# Patient Record
Sex: Female | Born: 1975 | Race: White | Hispanic: No | Smoking: Current every day smoker
Health system: Southern US, Community
[De-identification: ages and names within clinical notes are randomized; demographics above are authoritative.]

## PROBLEM LIST (undated history)

## (undated) DIAGNOSIS — M1990 Unspecified osteoarthritis, unspecified site: Secondary | ICD-10-CM

## (undated) DIAGNOSIS — A048 Other specified bacterial intestinal infections: Secondary | ICD-10-CM

## (undated) DIAGNOSIS — F329 Major depressive disorder, single episode, unspecified: Secondary | ICD-10-CM

## (undated) DIAGNOSIS — K529 Noninfective gastroenteritis and colitis, unspecified: Secondary | ICD-10-CM

## (undated) DIAGNOSIS — K2970 Gastritis, unspecified, without bleeding: Secondary | ICD-10-CM

## (undated) DIAGNOSIS — F4310 Post-traumatic stress disorder, unspecified: Secondary | ICD-10-CM

## (undated) DIAGNOSIS — F419 Anxiety disorder, unspecified: Secondary | ICD-10-CM

## (undated) DIAGNOSIS — F1920 Other psychoactive substance dependence, uncomplicated: Secondary | ICD-10-CM

## (undated) DIAGNOSIS — M67919 Unspecified disorder of synovium and tendon, unspecified shoulder: Secondary | ICD-10-CM

## (undated) DIAGNOSIS — F1020 Alcohol dependence, uncomplicated: Secondary | ICD-10-CM

## (undated) DIAGNOSIS — F32A Depression, unspecified: Secondary | ICD-10-CM

## (undated) DIAGNOSIS — S4290XA Fracture of unspecified shoulder girdle, part unspecified, initial encounter for closed fracture: Secondary | ICD-10-CM

## (undated) DIAGNOSIS — G479 Sleep disorder, unspecified: Secondary | ICD-10-CM

## (undated) DIAGNOSIS — N83209 Unspecified ovarian cyst, unspecified side: Secondary | ICD-10-CM

## (undated) DIAGNOSIS — M519 Unspecified thoracic, thoracolumbar and lumbosacral intervertebral disc disorder: Secondary | ICD-10-CM

## (undated) DIAGNOSIS — R110 Nausea: Secondary | ICD-10-CM

## (undated) DIAGNOSIS — M719 Bursopathy, unspecified: Secondary | ICD-10-CM

## (undated) DIAGNOSIS — S02600B Fracture of unspecified part of body of mandible, initial encounter for open fracture: Secondary | ICD-10-CM

## (undated) HISTORY — PX: APPENDECTOMY: SHX54

## (undated) HISTORY — PX: HERNIA REPAIR: SHX51

## (undated) HISTORY — PX: TUBAL LIGATION: SHX77

## (undated) HISTORY — PX: KNEE ARTHROSCOPY: SUR90

## (undated) HISTORY — PX: OOPHORECTOMY: SHX86

## (undated) HISTORY — PX: MANDIBLE FRACTURE SURGERY: SHX706

## (undated) HISTORY — PX: DILATION AND EVACUATION: SHX1459

## (undated) HISTORY — DX: Noninfective gastroenteritis and colitis, unspecified: K52.9

---

## 2011-02-02 DIAGNOSIS — G8929 Other chronic pain: Secondary | ICD-10-CM | POA: Diagnosis present

## 2011-02-02 DIAGNOSIS — F329 Major depressive disorder, single episode, unspecified: Secondary | ICD-10-CM | POA: Diagnosis present

## 2011-11-24 ENCOUNTER — Other Ambulatory Visit: Payer: Self-pay | Admitting: Oral Surgery

## 2011-11-24 DIAGNOSIS — S02609A Fracture of mandible, unspecified, initial encounter for closed fracture: Secondary | ICD-10-CM

## 2011-11-26 ENCOUNTER — Other Ambulatory Visit: Payer: Self-pay

## 2011-11-30 ENCOUNTER — Other Ambulatory Visit: Payer: Self-pay | Admitting: Oral Surgery

## 2011-11-30 ENCOUNTER — Ambulatory Visit
Admission: RE | Admit: 2011-11-30 | Discharge: 2011-11-30 | Disposition: A | Discharge status: Home / Self Care | Payer: Medicaid Other | Source: Ambulatory Visit | Attending: Oral Surgery | Admitting: Oral Surgery

## 2011-11-30 ENCOUNTER — Other Ambulatory Visit: Payer: Self-pay

## 2011-11-30 DIAGNOSIS — S02609A Fracture of mandible, unspecified, initial encounter for closed fracture: Secondary | ICD-10-CM

## 2012-01-17 ENCOUNTER — Other Ambulatory Visit (HOSPITAL_COMMUNITY): Payer: Self-pay | Admitting: Oral Surgery

## 2012-01-23 ENCOUNTER — Emergency Department (HOSPITAL_COMMUNITY)
Admission: EM | Admit: 2012-01-23 | Discharge: 2012-01-24 | Disposition: A | Discharge status: Other Acute Inpatient Hospital | Payer: 59 | Source: Home / Self Care

## 2012-01-23 ENCOUNTER — Encounter (HOSPITAL_COMMUNITY): Payer: Self-pay | Admitting: Emergency Medicine

## 2012-01-23 ENCOUNTER — Emergency Department (HOSPITAL_COMMUNITY): Payer: 59

## 2012-01-23 DIAGNOSIS — M719 Bursopathy, unspecified: Secondary | ICD-10-CM | POA: Insufficient documentation

## 2012-01-23 DIAGNOSIS — G8929 Other chronic pain: Secondary | ICD-10-CM

## 2012-01-23 DIAGNOSIS — F17200 Nicotine dependence, unspecified, uncomplicated: Secondary | ICD-10-CM | POA: Insufficient documentation

## 2012-01-23 DIAGNOSIS — G479 Sleep disorder, unspecified: Secondary | ICD-10-CM | POA: Insufficient documentation

## 2012-01-23 DIAGNOSIS — F341 Dysthymic disorder: Secondary | ICD-10-CM | POA: Insufficient documentation

## 2012-01-23 DIAGNOSIS — F259 Schizoaffective disorder, unspecified: Secondary | ICD-10-CM | POA: Insufficient documentation

## 2012-01-23 DIAGNOSIS — M67919 Unspecified disorder of synovium and tendon, unspecified shoulder: Secondary | ICD-10-CM | POA: Insufficient documentation

## 2012-01-23 DIAGNOSIS — F32A Depression, unspecified: Secondary | ICD-10-CM

## 2012-01-23 DIAGNOSIS — F329 Major depressive disorder, single episode, unspecified: Secondary | ICD-10-CM

## 2012-01-23 HISTORY — DX: Alcohol dependence, uncomplicated: F10.20

## 2012-01-23 HISTORY — DX: Other psychoactive substance dependence, uncomplicated: F19.20

## 2012-01-23 HISTORY — DX: Unspecified disorder of synovium and tendon, unspecified shoulder: M67.919

## 2012-01-23 HISTORY — DX: Anxiety disorder, unspecified: F41.9

## 2012-01-23 HISTORY — DX: Bursopathy, unspecified: M71.9

## 2012-01-23 HISTORY — DX: Sleep disorder, unspecified: G47.9

## 2012-01-23 HISTORY — DX: Post-traumatic stress disorder, unspecified: F43.10

## 2012-01-23 HISTORY — DX: Major depressive disorder, single episode, unspecified: F32.9

## 2012-01-23 HISTORY — DX: Depression, unspecified: F32.A

## 2012-01-23 HISTORY — DX: Unspecified thoracic, thoracolumbar and lumbosacral intervertebral disc disorder: M51.9

## 2012-01-23 LAB — CBC WITH DIFFERENTIAL/PLATELET
Basophils Absolute: 0 10*3/uL (ref 0.0–0.1)
Basophils Relative: 1 % (ref 0–1)
Hemoglobin: 13.9 g/dL (ref 12.0–15.0)
MCHC: 33.9 g/dL (ref 30.0–36.0)
Neutro Abs: 3.3 10*3/uL (ref 1.7–7.7)
Neutrophils Relative %: 44 % (ref 43–77)
RDW: 13.1 % (ref 11.5–15.5)

## 2012-01-23 LAB — BASIC METABOLIC PANEL
Chloride: 100 mEq/L (ref 96–112)
GFR calc Af Amer: >90 mL/min (ref >90)
Potassium: 3.5 mEq/L (ref 3.5–5.1)
Sodium: 139 mEq/L (ref 135–145)

## 2012-01-23 LAB — RAPID URINE DRUG SCREEN, HOSP PERFORMED
Benzodiazepines: POSITIVE — AB
Cocaine: NOT DETECTED

## 2012-01-23 LAB — ETHANOL: Alcohol, Ethyl (B): <11 mg/dL (ref 0–11)

## 2012-01-23 MED ORDER — CLONAZEPAM 0.5 MG PO TABS
1.0000 mg | ORAL_TABLET | Freq: Two times a day (BID) | ORAL | Status: DC
  Administered 2012-01-23 – 2012-01-24 (×2): 1 mg via ORAL
  Filled 2012-01-23 (×2): qty 2

## 2012-01-23 MED ORDER — LORAZEPAM 1 MG PO TABS
1.0000 mg | ORAL_TABLET | Freq: Four times a day (QID) | ORAL | Status: DC | PRN
  Administered 2012-01-23: 1 mg via ORAL
  Filled 2012-01-23: qty 1

## 2012-01-23 MED ORDER — OXYCODONE-ACETAMINOPHEN 5-325 MG PO TABS
1.0000 | ORAL_TABLET | Freq: Four times a day (QID) | ORAL | Status: DC | PRN
  Administered 2012-01-23 – 2012-01-24 (×2): 1 via ORAL
  Filled 2012-01-23 (×2): qty 1

## 2012-01-23 MED ORDER — IBUPROFEN 400 MG PO TABS
600.0000 mg | ORAL_TABLET | Freq: Three times a day (TID) | ORAL | Status: DC | PRN
  Administered 2012-01-23: 600 mg via ORAL
  Filled 2012-01-23: qty 2

## 2012-01-23 MED ORDER — DULOXETINE HCL 30 MG PO CPEP
30.0000 mg | ORAL_CAPSULE | Freq: Every day | ORAL | Status: DC
  Administered 2012-01-23 – 2012-01-24 (×2): 30 mg via ORAL
  Filled 2012-01-23 (×4): qty 1

## 2012-01-23 MED ORDER — OXYCODONE-ACETAMINOPHEN 5-325 MG PO TABS
1.0000 | ORAL_TABLET | Freq: Once | ORAL | Status: AC
  Administered 2012-01-23: 1 via ORAL
  Filled 2012-01-23 (×2): qty 1

## 2012-01-23 NOTE — ED Notes (Signed)
Tammy Sours from act paged
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
## 2012-01-23 NOTE — ED Notes (Signed)
Waiting on telepsych 

## 2012-01-23 NOTE — ED Notes (Signed)
Pts daughter called and asked to speak to pt. Explained to her daughter that pt was sleeping and I would inform her to return the call when she wakes up.

## 2012-01-23 NOTE — ED Notes (Addendum)
Admission to behavioral health discussed with patient who does not agree to a zero benzodiazapine and zero opiate program, the patient states, "I can't do that," information passed on to Behavioral health.  [facility] Behavioral Health admission declined at this time.
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
## 2012-01-23 NOTE — ED Notes (Signed)
Patient;'s belongings taken home per daughter

## 2012-01-23 NOTE — ED Notes (Signed)
Meal tray provided to pt. Pt requesting xray of right shoulder and lumbar spine, EMD aware and tests ordered.

## 2012-01-23 NOTE — ED Notes (Signed)
Patient complains of pain in her shoulder and back, states it is the pain that she normally has.  Pt was reminded that she was given pain medication around 530 pm, ibuprofen offered which pt accepted.  Pt converses easily, smiles, appears in no acute distress.
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
## 2012-01-23 NOTE — ED Notes (Signed)
Pt is now awake and asking for pain medication.

## 2012-01-23 NOTE — ED Notes (Signed)
Plan for pt: Inpt treatment. Waiting on bed placement.   Pt states, "That Perc 5 didn't do a thing for me." Explained to pt that she is prescribed Percocet q 6 hours PRN. And informed her that it was stronger than the hydrocodone she is currently on at home and that nothing stronger can be given at this time. Pt sitting up in bed eating and talking to her brother at bedside. No moaning, grimacing, or guarding noted.

## 2012-01-24 ENCOUNTER — Inpatient Hospital Stay (HOSPITAL_COMMUNITY)
Admission: EM | Admit: 2012-01-24 | Discharge: 2012-01-27 | DRG: 881 | Disposition: A | Discharge status: Home / Self Care | Payer: 59 | Source: Ambulatory Visit | Attending: Psychiatry | Admitting: Psychiatry

## 2012-01-24 ENCOUNTER — Encounter (HOSPITAL_COMMUNITY): Payer: Self-pay | Admitting: *Deleted

## 2012-01-24 DIAGNOSIS — IMO0002 Reserved for concepts with insufficient information to code with codable children: Secondary | ICD-10-CM

## 2012-01-24 DIAGNOSIS — F3289 Other specified depressive episodes: Principal | ICD-10-CM | POA: Diagnosis present

## 2012-01-24 DIAGNOSIS — G8929 Other chronic pain: Secondary | ICD-10-CM | POA: Diagnosis present

## 2012-01-24 DIAGNOSIS — F1021 Alcohol dependence, in remission: Secondary | ICD-10-CM | POA: Diagnosis present

## 2012-01-24 DIAGNOSIS — F329 Major depressive disorder, single episode, unspecified: Secondary | ICD-10-CM

## 2012-01-24 DIAGNOSIS — Z23 Encounter for immunization: Secondary | ICD-10-CM

## 2012-01-24 DIAGNOSIS — F1921 Other psychoactive substance dependence, in remission: Secondary | ICD-10-CM | POA: Diagnosis present

## 2012-01-24 DIAGNOSIS — F4310 Post-traumatic stress disorder, unspecified: Secondary | ICD-10-CM

## 2012-01-24 MED ORDER — TRAZODONE HCL 50 MG PO TABS
50.0000 mg | ORAL_TABLET | Freq: Every evening | ORAL | Status: DC | PRN
  Administered 2012-01-25 – 2012-01-26 (×4): 50 mg via ORAL
  Filled 2012-01-24 (×8): qty 1

## 2012-01-24 MED ORDER — PNEUMOCOCCAL VAC POLYVALENT 25 MCG/0.5ML IJ INJ
0.5000 mL | INJECTION | INTRAMUSCULAR | Status: AC
  Administered 2012-01-25: 0.5 mL via INTRAMUSCULAR

## 2012-01-24 MED ORDER — CLONIDINE HCL 0.1 MG PO TABS: 0.1000 mg | ORAL_TABLET | ORAL | Status: DC

## 2012-01-24 MED ORDER — METHOCARBAMOL 500 MG PO TABS
500.0000 mg | ORAL_TABLET | Freq: Three times a day (TID) | ORAL | Status: DC | PRN
  Administered 2012-01-24 – 2012-01-25 (×2): 500 mg via ORAL
  Filled 2012-01-24 (×2): qty 1

## 2012-01-24 MED ORDER — CHLORDIAZEPOXIDE HCL 25 MG PO CAPS: 25.0000 mg | ORAL_CAPSULE | Freq: Three times a day (TID) | ORAL | Status: DC

## 2012-01-24 MED ORDER — INFLUENZA VIRUS VACC SPLIT PF IM SUSP
0.5000 mL | INTRAMUSCULAR | Status: AC
  Administered 2012-01-25: 0.5 mL via INTRAMUSCULAR

## 2012-01-24 MED ORDER — HYDROXYZINE HCL 25 MG PO TABS
25.0000 mg | ORAL_TABLET | Freq: Four times a day (QID) | ORAL | Status: DC | PRN
  Administered 2012-01-24: 25 mg via ORAL
  Filled 2012-01-24: qty 1

## 2012-01-24 MED ORDER — ACETAMINOPHEN 325 MG PO TABS: 650.0000 mg | ORAL_TABLET | Freq: Four times a day (QID) | ORAL | Status: DC | PRN

## 2012-01-24 MED ORDER — CHLORDIAZEPOXIDE HCL 25 MG PO CAPS: 25.0000 mg | ORAL_CAPSULE | Freq: Every day | ORAL | Status: DC

## 2012-01-24 MED ORDER — NAPROXEN 500 MG PO TABS: 500.0000 mg | ORAL_TABLET | Freq: Two times a day (BID) | ORAL | Status: DC | PRN

## 2012-01-24 MED ORDER — CHLORDIAZEPOXIDE HCL 25 MG PO CAPS: 25.0000 mg | ORAL_CAPSULE | Freq: Four times a day (QID) | ORAL | Status: DC | PRN

## 2012-01-24 MED ORDER — ADULT MULTIVITAMIN W/MINERALS CH
1.0000 | ORAL_TABLET | Freq: Every day | ORAL | Status: DC
  Administered 2012-01-25 – 2012-01-27 (×3): 1 via ORAL
  Filled 2012-01-24 (×4): qty 1

## 2012-01-24 MED ORDER — LOPERAMIDE HCL 2 MG PO CAPS: 2.0000 mg | ORAL_CAPSULE | ORAL | Status: DC | PRN

## 2012-01-24 MED ORDER — CLONIDINE HCL 0.1 MG PO TABS: 0.1000 mg | ORAL_TABLET | Freq: Every day | ORAL | Status: DC

## 2012-01-24 MED ORDER — MAGNESIUM HYDROXIDE 400 MG/5ML PO SUSP: 30.0000 mL | Freq: Every day | ORAL | Status: DC | PRN

## 2012-01-24 MED ORDER — CHLORDIAZEPOXIDE HCL 25 MG PO CAPS
25.0000 mg | ORAL_CAPSULE | Freq: Four times a day (QID) | ORAL | Status: DC
  Administered 2012-01-24 – 2012-01-25 (×3): 25 mg via ORAL
  Filled 2012-01-24 (×3): qty 1

## 2012-01-24 MED ORDER — ALUM & MAG HYDROXIDE-SIMETH 200-200-20 MG/5ML PO SUSP: 30.0000 mL | ORAL | Status: DC | PRN

## 2012-01-24 MED ORDER — ONDANSETRON 4 MG PO TBDP: 4.0000 mg | ORAL_TABLET | Freq: Four times a day (QID) | ORAL | Status: DC | PRN

## 2012-01-24 MED ORDER — DULOXETINE HCL 30 MG PO CPEP
30.0000 mg | ORAL_CAPSULE | Freq: Every day | ORAL | Status: DC
  Administered 2012-01-25: 30 mg via ORAL
  Filled 2012-01-24 (×3): qty 1

## 2012-01-24 MED ORDER — VITAMIN B-1 100 MG PO TABS
100.0000 mg | ORAL_TABLET | Freq: Every day | ORAL | Status: DC
  Administered 2012-01-25: 100 mg via ORAL
  Filled 2012-01-24 (×2): qty 1

## 2012-01-24 MED ORDER — THIAMINE HCL 100 MG/ML IJ SOLN
100.0000 mg | Freq: Once | INTRAMUSCULAR | Status: AC
  Administered 2012-01-24: 100 mg via INTRAMUSCULAR

## 2012-01-24 MED ORDER — DICYCLOMINE HCL 20 MG PO TABS: 20.0000 mg | ORAL_TABLET | Freq: Four times a day (QID) | ORAL | Status: DC | PRN

## 2012-01-24 MED ORDER — CHLORDIAZEPOXIDE HCL 25 MG PO CAPS: 25.0000 mg | ORAL_CAPSULE | ORAL | Status: DC

## 2012-01-24 MED ORDER — CLONIDINE HCL 0.1 MG PO TABS
0.1000 mg | ORAL_TABLET | Freq: Four times a day (QID) | ORAL | Status: DC
  Administered 2012-01-24 – 2012-01-25 (×3): 0.1 mg via ORAL
  Filled 2012-01-24 (×7): qty 1

## 2012-01-24 NOTE — BHH Counselor (Signed)
The patient yesterday, was accepted to [facility], but she would not agree to going into treatment with her pain medications, and anti- anxiety medications. Today it was discussed again with the patient the need to go inpatient to evaluate her depression and to evaluate the need for the intervention with an anti-depressant. We explained that she would not be given opiates and benzo's while in the hospital. We calmed her concerns regarding her long standing pain and her fears of withdrawal. The [patient now agrees to admission at [facility] and understands that she will not be given opiates and benzo's. Called admissions and spoke with Elijah Birk, he will rerun the patient for admission. Dr Deretha Emory advised of patient status.

## 2012-01-24 NOTE — ED Notes (Signed)
[facility] called for transport to [facility].  Spoke with [employer].  States, "they will send a truck as soon as one is available".

## 2012-01-24 NOTE — Progress Notes (Signed)
Pt attended AA meeting this evening.  

## 2012-01-24 NOTE — Tx Team (Signed)
Initial Interdisciplinary Treatment Plan  PATIENT STRENGTHS: (choose at least two) Average or above average intelligence General fund of knowledge Motivation for treatment/growth Supportive family/friends  PATIENT STRESSORS: Substance abuse Traumatic event   PROBLEM LIST: Problem List/Patient Goals Date to be addressed Date deferred Reason deferred Estimated date of resolution                                                         DISCHARGE CRITERIA:  Ability to meet basic life and health needs Improved stabilization in mood, thinking, and/or behavior Medical problems require only outpatient monitoring Need for constant or close observation no longer present Reduction of life-threatening or endangering symptoms to within safe limits  PRELIMINARY DISCHARGE PLAN: Attend aftercare/continuing care group  PATIENT/FAMIILY INVOLVEMENT: This treatment plan has been presented to and reviewed with the patient, Brittany Rios.  The patient and family have been given the opportunity to ask questions and make suggestions.  [name] M 01/24/2012, 4:17 PM

## 2012-01-24 NOTE — ED Provider Notes (Signed)
0700 Patient slept through the night. Patient with major depression. Tele psych recommended inpatient treatment. ACT team is in process of trying to place the patient. Will resume attempts today. No behavioral issues overnight. VSS.  Nicoletta Dress. Colon Branch, MD 01/24/12 [phone]
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
## 2012-01-24 NOTE — BH Assessment (Signed)
BHH Assessment Progress Note   Pt accepted to [facility] by Verne Spurr, PA to the service of Geoffery Lyons, MD, Rm 300-2.  This writer notified Theodoro Kos, RN, Chiropod[facility] and Adult Unit staff that a NAME ALERT applies to this patient to avoid confusion with a current patient at [facility].  I called Frances Maywood, Assessment Counselor to notify him.  Doylene Canning, MA, Assessment Counselor 01/24/2012 @ 11:16

## 2012-01-24 NOTE — BHH Counselor (Signed)
The patient has been accepted by Tessa Lerner to the service of Dr Charline Bills Dub Mikes. She is assigned to room 300-02. She will be transported by [facility]. Support paperwork completed and faxed to [facility]. Medicaid was authorized by [facility]. Spoke with Elnita Maxwell who approved 3 days with review on 01/26/2012. Authorization #  Is [id number].

## 2012-01-24 NOTE — ED Notes (Signed)
Pt ate breakfast, sitter at bedside.  Sitter will offer pt a shower when pt finishes using the telephone.

## 2012-01-25 ENCOUNTER — Encounter (HOSPITAL_COMMUNITY): Payer: Self-pay | Admitting: Psychiatry

## 2012-01-25 DIAGNOSIS — F329 Major depressive disorder, single episode, unspecified: Secondary | ICD-10-CM

## 2012-01-25 DIAGNOSIS — F4310 Post-traumatic stress disorder, unspecified: Secondary | ICD-10-CM

## 2012-01-25 MED ORDER — CITALOPRAM HYDROBROMIDE 20 MG PO TABS
20.0000 mg | ORAL_TABLET | Freq: Every day | ORAL | Status: DC
  Administered 2012-01-25 – 2012-01-27 (×3): 20 mg via ORAL
  Filled 2012-01-25 (×5): qty 1

## 2012-01-25 MED ORDER — DIAZEPAM 2 MG PO TABS
2.0000 mg | ORAL_TABLET | Freq: Three times a day (TID) | ORAL | Status: DC | PRN
  Administered 2012-01-25 (×2): 2 mg via ORAL
  Filled 2012-01-25: qty 1

## 2012-01-25 MED ORDER — HYDROCODONE-ACETAMINOPHEN 10-325 MG PO TABS
1.0000 | ORAL_TABLET | ORAL | Status: DC | PRN
  Administered 2012-01-25 – 2012-01-27 (×10): 1 via ORAL
  Filled 2012-01-25 (×10): qty 1

## 2012-01-25 NOTE — Progress Notes (Signed)
Pt reports she feels safe here.  She says she feels more relaxed, but still has pain 9/10.  Pt's prn Norco is given during this conversation.  Pt denies SI/HI/AV.  Pt was encouraged to make her needs known to staff.  Pt voices understanding.  Pt is unsure of her discharge plans. Pt says she has been attending groups.  Pt reports no withdrawal symptoms at this time.  Safety maintained with q15 minute checks.
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
## 2012-01-25 NOTE — Progress Notes (Signed)
Psychoeducational Group Note  Date:  01/25/2012 Time:  1100  Group Topic/Focus:  Recovery Goals:   The focus of this group is to identify appropriate goals for recovery and establish a plan to achieve them.  Participation Level:  Active  Participation Quality:  Appropriate and Attentive  Affect:  Appropriate  Cognitive:  Alert and Appropriate  Insight:  Good  Engagement in Group:  Good  Additional Comments:  Pt. Identified changes they want to make in order to achieve recovery as well as goals in order to make those changes.    Antario Yasuda Meredith 01/25/2012, 1:04 PM 

## 2012-01-25 NOTE — Progress Notes (Signed)
D:  Patient up and in the milieu this morning.  Attending and participating in groups.  Rates her pain at an 8 this morning.  Did not want anything further for pain.  She appears flat and her speech is soft.   A:  Medications given as ordered.  Received influenza and pneumovax vaccines this morning.  Patient was moved to the 500 hall and report was given to [place], Charity fundraiser.  Her new medication orders were reviewed with her.   R:  Patient verbalized understanding of new medications and how to take them.  She has been pleasant and cooperative with staff and peers.
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
## 2012-01-25 NOTE — Progress Notes (Signed)
Patient ID: Brittany Rios, female   DOB: 02/05/1976, 36 y.o.   MRN: [id number] After assessing the diagnostic tests from the ED, her home medications, and her present pain S&S, the patient was moved to the 500 hall to get medication support for her pain.  The patient is in agreement with the plan developed by the MD and practitioner to best meet her needs:  Physical and mental needs.
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
## 2012-01-25 NOTE — Progress Notes (Signed)
[facility] Group Notes:  (Counselor/Nursing/MHT/Case Management/Adjunct)  01/25/2012 9:46 PM  Type of Therapy:  Psychoeducational Skills  Participation Level:  Active  Participation Quality:  Appropriate  Affect:  Anxious and Appropriate  Cognitive:  Appropriate  Insight:  Good  Engagement in Group:  Good  Engagement in Therapy:  Good  Modes of Intervention:  Problem-solving  Summary of Progress/Problems: Brittany Rios was able to apply her actions with the topic of discussion during group.  Brittany Rios is looking forward in getting better.    [name] 01/25/2012, 9:46 PM
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
## 2012-01-25 NOTE — Progress Notes (Signed)
Patient ID: Brittany Rios, female   DOB: 05/02/1975, 36 y.o.   MRN: [id number]  D: Pt was sad and depressed during the assessment. Pt explained that she's not suicidal. "I would never hurt myself because I have too much to live for". Pt was quiet and Clinical research associate didn't notice any interaction with peers.  A:  Support and encouragement was offered. 15 min checks were continued for safety.  R:  Pt remains safe.

## 2012-01-26 DIAGNOSIS — F329 Major depressive disorder, single episode, unspecified: Principal | ICD-10-CM

## 2012-01-26 DIAGNOSIS — F3289 Other specified depressive episodes: Principal | ICD-10-CM

## 2012-01-26 MED ORDER — DIAZEPAM 2 MG PO TABS
2.0000 mg | ORAL_TABLET | Freq: Three times a day (TID) | ORAL | Status: DC | PRN
  Administered 2012-01-26 – 2012-01-27 (×2): 2 mg via ORAL
  Filled 2012-01-26 (×2): qty 1

## 2012-01-26 NOTE — Progress Notes (Signed)
D: Patient cooperative with staff and peers, but mood is sad/depressed. Patient complained of backache 9/10. She reported on self-inventory sheet that her energy level is normal and ability to pay attention is good. Patient rated depression and feelings of hopelessness a "1". Her goal/changes she plan to make to better care for self are take more time for self, get into an outpatient program and church.   A: Support and encouragement provided to patient. Administered scheduled medications per MD orders. PRN Hydrocodone given for pain. Maintain 15 minute checks for safety.  R: Patient receptive. Patient reported decrease in pain 5/10. Denies SI/HI/AVH. Patient remains safe.

## 2012-01-26 NOTE — Tx Team (Signed)
Interdisciplinary Treatment Plan Update (Adult)  Date:  01/26/2012  Time Reviewed:  10:02 AM   Progress in Treatment: Attending groups: Yes Participating in groups:  Yes Taking medication as prescribed: Yes Tolerating medication:  Yes Family/Significant othe contact made:  CSW will assess for appropriate contact Patient understands diagnosis:  Yes Discussing patient identified problems/goals with staff:  Yes Medical problems stabilized or resolved:  Yes Denies suicidal/homicidal ideation: Yes Issues/concerns per patient self-inventory:  None identified Other: N/A  New problem(s) identified: None Identified  Reason for Continuation of Hospitalization: Anxiety Depression Medication stabilization Withdrawal symptoms   Interventions implemented related to continuation of hospitalization: mood stabilization, medication monitoring and adjustment, group therapy and psycho education, safety checks q 15 mins  Additional comments: N/A  Estimated length of stay: 1-2 days  Discharge Plan: SW is assessing for appropriate referrals.   New goal(s): N/A  Review of initial/current patient goals per problem list:    1.  Goal(s): Address substance use  Met:  No  Target date: by discharge  As evidenced by: completing detox protocol and refer to appropriate treatment  2.  Goal (s): Reduce depressive and anxiety symptoms  Met:  No  Target date: by discharge  As evidenced by: Reducing depression from a 10 to a 3 as reported by pt.    3.  Goal (s): Eliminate SI  Met:  No  Target date: by discharge  As evidenced by: Pt denying SI Attendees: Patient:   01/26/2012 9:55 AM   Family:     Physician:  Geoffery Lyons, MD 01/26/2012 9:55 AM   Nursing: Roswell Miners, RN 01/26/2012 9:55 AM   Clinical Social Worker:  Reyes Ivan, LCSWA 01/26/2012 9:55 AM   Other: Waynetta Sandy, RN 01/26/2012 9:55 AM   Other:  Dennison Nancy, RN 01/26/2012 9:56 AM   Other:  Celso Amy, NP 01/26/2012 9:56 AM    Other:  Bubba Camp, Psyc intern 01/26/2012 9:56 AM   Other:      Scribe for Treatment Team:   Reyes Ivan 01/26/2012 9:55 AM

## 2012-01-26 NOTE — Progress Notes (Signed)
Pt reports her day has been better.  She says she may be discharged on Thursday to home.  She says she misses her family and she is ready for discharge.  She denies SI/HI/AV.  She reports she has attended groups and says they are helpful.  Her pain is tolerable  with the Norco and Valium.  She makes her needs known to staff.  Support/encouragement given.  Safety maintained with q15 minute checks.

## 2012-01-26 NOTE — Progress Notes (Addendum)
Psychoeducational Group Note  Date:  01/26/2012 Time:  1100  Group Topic/Focus:  Personal Choices and Values:   The focus of this group is to help patients assess and explore the importance of values in their lives, how their values affect their decisions, how they express their values and what opposes their expression.  Participation Level:  Minimal  Participation Quality:  Appropriate and Attentive  Affect:  Appropriate  Cognitive:  Appropriate  Insight:  Good  Engagement in Group:  Limited  Additional Comments:   In group of Identifying values and choices, patient participated and shared personal definition on what values meant and how values and choices affect personal life. Patient also gave examples of how values could change throughout our lifespan. Patient was given the opportunity to go through identifying values form in workbook and state what values were important in patient life. Patient then completed choosing a value-oriented life worksheet in relation to identifying values form. Patient would knod her head when agreeance to statements shared within the group.   Karleen Hampshire Brittini 01/26/2012, 1:56 PM
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
## 2012-01-26 NOTE — Progress Notes (Signed)
[facility] MD Progress Note  01/26/2012 1:28 PM Brittany Rios  MRN:  [id number]  Diagnosis:   Axis I: Depressive Disorder NOS, Depressive Disorder secondary to general medical condition and Post Traumatic Stress Disorder Axis II: Deferred Axis III:  Past Medical History  Diagnosis Date  . Disc disorder   . Sleep disturbance   . Depression   . 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 Anxiety   . Schizo affective schizophrenia   . PTSD (post-traumatic stress disorder)   . Alcoholism   . Drug addiction    Axis IV: other psychosocial or environmental problems, problems related to social environment and problems with primary support group Axis V: 51-60 moderate symptoms  ADL's:  Intact  Sleep: Fair  Appetite:  Good  Suicidal Ideation:  Passive, contracts at this time Homicidal Ideation:  Denies  AEB (as evidenced by):  Mental Status Examination/Evaluation: Objective:  Appearance: Casual and Disheveled  Eye Contact::  Fair  Speech:  Normal Rate  Volume:  Decreased  Mood:  Depressed  Affect:  Constricted  Thought Process:  Logical  Orientation:  Full  Thought Content:  WDL  Suicidal Thoughts:  Yes.  without intent/plan  Homicidal Thoughts:  No  Memory:  Immediate;   Fair Recent;   Fair Remote;   Fair  Judgement:  Impaired  Insight:  Fair  Psychomotor Activity:  Decreased  Concentration:  Fair  Recall:  Fair  Akathisia:  No  Handed:  Right  AIMS (if indicated):     Assets:  Communication Skills Desire for Improvement Housing Social Support Transportation  Sleep:  Number of Hours: 5.5    Vital Signs:Blood pressure 95/63, pulse 79, temperature 99.1 F (37.3 C), temperature source Oral, resp. rate 16, height 5' 8.5" (1.74 m), weight 62.143 kg (137 lb), last menstrual period 12/24/2011, SpO2 95.00%. Current Medications: Current Facility-Administered Medications  Medication Dose Route Frequency Provider Last Rate Last Dose  . acetaminophen (TYLENOL)  tablet 650 mg  650 mg Oral Q6H PRN Verne Spurr, PA-C      . alum & mag hydroxide-simeth (MAALOX/MYLANTA) 200-200-20 MG/5ML suspension 30 mL  30 mL Oral Q4H PRN Verne Spurr, PA-C      . citalopram (CELEXA) tablet 20 mg  20 mg Oral Daily Nanine Means, NP   20 mg at 01/26/12 0750  . diazepam (VALIUM) tablet 2 mg  2 mg Oral Q8H PRN Nanine Means, NP   2 mg at 01/25/12 2231  . HYDROcodone-acetaminophen (NORCO) 10-325 MG per tablet 1 tablet  1 tablet Oral Q4H PRN Nanine Means, NP   1 tablet at 01/26/12 0859  . loperamide (IMODIUM) capsule 2-4 mg  2-4 mg Oral PRN Verne Spurr, PA-C      . magnesium hydroxide (MILK OF MAGNESIA) suspension 30 mL  30 mL Oral Daily PRN Verne Spurr, PA-C      . multivitamin with minerals tablet 1 tablet  1 tablet Oral Daily Verne Spurr, PA-C   1 tablet at 01/26/12 0749  . traZODone (DESYREL) tablet 50 mg  50 mg Oral QHS,MR X 1 Kerry Hough, PA   50 mg at 01/25/12 2157    Lab Results: No results found for this or any previous visit (from the past 48 hour(s)).  Physical Findings: AIMS: Facial and Oral Movements Muscles of Facial Expression: None, normal Lips and Perioral Area: None, normal Jaw: None, normal Tongue: None, normal,Extremity Movements Upper (arms, wrists, hands, fingers): None, normal Lower (legs, knees, ankles, toes): None, normal, Trunk  Movements Neck, shoulders, hips: None, normal, Overall Severity Severity of abnormal movements (highest score from questions above): None, normal Incapacitation due to abnormal movements: None, normal Patient's awareness of abnormal movements (rate only patient's report): No Awareness, Dental Status Current problems with teeth and/or dentures?: No Does patient usually wear dentures?: No  CIWA:  CIWA-Ar Total: 8  COWS:  COWS Total Score: 0   Treatment Plan Summary: Daily contact with patient to assess and evaluate symptoms and progress in treatment Medication management  Plan:  Patient desires discharge  tomorrow, will follow-up with outside provider, cooperative, she stated she is looking forward to seeing her family, patient could describe coping mechanisms to use after discharge when she becomes depressed (walk, talk to a friend, etc), encouraged to follow-up with a pain clinic if pain becomes intolerable, yoga/meditation/walking encouraged, mindfulness practice activities given to the patient  Nanine Means 01/26/2012, 1:28 PM

## 2012-01-26 NOTE — Progress Notes (Signed)
Psychoeducational Group Note  Date:  01/26/2012 Time: 2000  Group Topic/Focus:  Wrap-Up Group:   The focus of this group is to help patients review their daily goal of treatment and discuss progress on daily workbooks.  Participation Level:  Active  Participation Quality:  Resistant  Affect:  Depressed and Flat  Cognitive:  Oriented  Insight:  Limited  Engagement in Group:  Limited  Additional Comments:  Patient shared that she did not have a good day and that she has been tired and she hopes she can go home tomorrow.  Amelia Macken, [place] 01/26/2012, 10:16 PM

## 2012-01-26 NOTE — Progress Notes (Signed)
[facility] Adult Inpatient Family/Significant Other Suicide Prevention Education  Suicide Prevention Education:  Education Completed; Brittany Rios - mother [phone]),  (name of family member/significant other) has been identified by the patient as the family member/significant other with whom the patient will be residing, and identified as the person(s) who will aid the patient in the event of a mental health crisis (suicidal ideations/suicide attempt).  With written consent from the patient, the family member/significant other has been provided the following suicide prevention education, prior to the and/or following the discharge of the patient.  The suicide prevention education provided includes the following:  Suicide risk factors  Suicide prevention and interventions  National Suicide Hotline telephone number  [facility] assessment telephone number  [facility] Emergency Assistance 911  [facility] and/or Residential Mobile Crisis Unit telephone number  Request made of family/significant other to:  Remove weapons (e.g., guns, rifles, knives), all items previously/currently identified as safety concern.    Remove drugs/medications (over-the-counter, prescriptions, illicit drugs), all items previously/currently identified as a safety concern.  The family member/significant other verbalizes understanding of the suicide prevention education information provided.  The family member/significant other agrees to remove the items of safety concern listed above.  Brittany Rios 01/26/2012, 11:16 AM

## 2012-01-27 DIAGNOSIS — F339 Major depressive disorder, recurrent, unspecified: Secondary | ICD-10-CM

## 2012-01-27 MED ORDER — CITALOPRAM HYDROBROMIDE 20 MG PO TABS: 20.0000 mg | ORAL_TABLET | Freq: Every day | ORAL | Status: DC

## 2012-01-27 MED ORDER — TRAZODONE HCL 50 MG PO TABS: 50.0000 mg | ORAL_TABLET | Freq: Every evening | ORAL | Status: DC | PRN

## 2012-01-27 NOTE — Progress Notes (Signed)
[facility] Case Management Discharge Plan:  Will you be returning to the same living situation after discharge: Yes,  returning home At discharge, do you have transportation home?:Yes,  access to transportation Do you have the ability to pay for your medications:Yes,  access to meds   Release of information consent forms completed and in the chart;  Patient's signature needed at discharge.  Patient to Follow up at:  Follow-up Information    Follow up with [facility]. On 01/31/2012. (Appointment scheduled at 7:45 am, referral # [id number])    Contact information:   [address] [place] [id number] [phone]         Patient denies SI/HI:   Yes,  denies SI/HI    Safety Planning and Suicide Prevention discussed:  Yes,  discussed with pt  Barrier to discharge identified:No.  Summary and Recommendations: Pt attended discharge planning group and actively participated in group.  SW provided pt with today's workbook.  Pt presents with calm mood and affect.  Pt rates depression and anxiety at a 0 today.  Pt denies SI/HI today.  Pt reports feeling stable to d/c.  No recommendations from SW.  No further needs voiced by pt.  Pt stable to discharge.     Carmina Miller 01/27/2012, 10:46 AM

## 2012-01-27 NOTE — Progress Notes (Signed)
D:  Patient 's self inventory sheet, patient needs sleep medication.   Has good appetite, normal energy level, good attention span.  Rated depression and hopelessness #1.  Denied withdrawals.   Denied SI.   Has experienced pain in past 24 hours.  Worst pain #6.  After discharge, will "exercise, walking, taking special time for myself, get counseling."  Does have discharge plans.  No problems taking meds after discharge. A:  Medications given per MD order.  Support and encouragement given throughout day.  Support and safety checks completed as ordered.  R:  Following treatment plan.  Denied SI and HI.  Denied A/V hallucinations.  Patient remains safe and receptive on unit. Contracts for safety.

## 2012-01-27 NOTE — Progress Notes (Signed)
Patient ID: Brittany Rios, female   DOB: 1975/09/04, 36 y.o.   MRN: [id number] Discharge Note:  Patient discharged home with family members.  Denied SI and HI.   Denied A/V hallucinations.   Stated she was feeling much better emotionally and physically.  Stated she appreciated all the staff has done to assist her while at [facility].  Patient received suicide information which was explained to her and she stated she understood and had no questions.   Patient stated she does not have access to any firearms.  Stated she received all her belongings, clothing, miscellaneous items, prescriptions, medicaitons.  Patient has been cooperative and pleasant.
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
## 2012-01-27 NOTE — Progress Notes (Signed)
Psychoeducational Group Note  Date:  01/27/2012 Time:  1000  Group Topic/Focus:  Wellness Toolbox:   The focus of this group is to discuss various aspects of wellness, balancing those aspects and exploring ways to increase the ability to experience wellness.  Patients will create a wellness toolbox for use upon discharge.  Participation Level:  Active  Participation Quality:  Appropriate, Attentive, Sharing and Supportive  Affect:  Appropriate  Cognitive:  Alert and Appropriate  Insight:  Good  Engagement in Group:  Good  Additional Comments:  Productive group  Earline Mayotte 01/27/2012, 6:31 PM

## 2012-01-27 NOTE — Tx Team (Signed)
Interdisciplinary Treatment Plan Update (Adult)  Date:  01/27/2012  Time Reviewed:  10:07 AM   Progress in Treatment: Attending groups: Yes Participating in groups:  Yes Taking medication as prescribed: Yes Tolerating medication:  Yes Family/Significant othe contact made:  Yes Patient understands diagnosis:  Yes Discussing patient identified problems/goals with staff:  Yes Medical problems stabilized or resolved:  Yes Denies suicidal/homicidal ideation: Yes Issues/concerns per patient self-inventory:  None identified Other: N/A  New problem(s) identified: None Identified  Reason for Continuation of Hospitalization: Stable to d/c  Interventions implemented related to continuation of hospitalization: Stable to d/c  Additional comments: N/A  Estimated length of stay: D/C today  Discharge Plan: Pt will follow up with Arna Medici for medication management and therapy.    New goal(s): N/A  Review of initial/current patient goals per problem list:    1.  Goal(s): Address substance use  Met:  Yes  Target date: by discharge  As evidenced by: completed detox protocol and referred to appropriate treatment  2.  Goal (s): Reduce depressive and anxiety symptoms  Met:  Yes  Target date: by discharge  As evidenced by: Reducing depression from a 10 to a 3 as reported by pt.  Pt rates at a 0 today.   3.  Goal(s): Eliminate SI  Met:  Yes  Target date: by discharge  As evidenced by: Pt denies SI.    Attendees: Patient:   01/27/2012 10:07 AM   Family:     Physician:  Geoffery Lyons, MD 01/27/2012 10:07 AM   Nursing:  01/27/2012 10:07 AM   Clinical Social Worker:  Reyes Ivan, LCSWA 01/27/2012 10:07 AM   Other:  01/27/2012 10:07 AM   Other:     Other:     Other:     Other:      Scribe for Treatment Team:   Reyes Ivan 01/27/2012 10:07 AM

## 2012-01-27 NOTE — Progress Notes (Signed)
[facility] Group Notes:  (Counselor/Nursing/MHT/Case Management/Adjunct)  01/27/2012 3:43 PM  Type of Therapy:  Psychoeducational Skills  Participation Level:  Minimal  Participation Quality:  Appropriate and Attentive  Affect:  Blunted  Cognitive:  Appropriate and Oriented  Insight:  Good  Engagement in Group:  Good  Engagement in Therapy:  n/a  Modes of Intervention:  Activity, Education, Problem-solving, Socialization and Support  Summary of Progress/Problems: Brittany Rios attended Psychoeducational group on labels. Brittany Rios participated in an activity labeling self and peers and choose to label herself as a Secretary/administrator for the activity. Brittany Rios was quiet but attentive while group discussed what labels are, how we use them, how they effect our way of thinking, and listed positive and negative labels they have had. Brittany Rios was given a homework assignment to list 10 ways she has been labeled and to find the reality of the situation/label.    Wandra Scot 01/27/2012, 3:43 PM

## 2012-01-31 NOTE — Progress Notes (Signed)
Agree with assessment and disposition  Madie Reno A. Dub Mikes, M.D.

## 2012-02-01 NOTE — Progress Notes (Signed)
Agree with assessment and plan  Brittany Rios. M.D. 

## 2012-02-01 NOTE — Progress Notes (Signed)
Patient Discharge Instructions:  After Visit Summary (AVS):   Faxed to:  02/01/12 Psychiatric Admission Assessment Note:   Faxed to:  02/01/12 Suicide Risk Assessment - Discharge Assessment:   Faxed to:  02/01/12 Faxed/Sent to the Next Level Care provider:  02/01/12 Faxed to [facility] @ [phone]  Jerelene Redden, 02/01/2012, 3:06 PM
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
## 2012-02-03 ENCOUNTER — Encounter (HOSPITAL_COMMUNITY): Payer: Self-pay | Admitting: Pharmacy Technician

## 2012-02-06 NOTE — H&P (Signed)
Agree with assessment and plan Tanaia Hawkey A. Mitesh Rosendahl, M.D. 

## 2012-02-06 NOTE — Discharge Summary (Signed)
Agree with assessment and plan Brittany Rios A. Kartel Wolbert, M.D. 

## 2012-02-09 ENCOUNTER — Encounter (HOSPITAL_COMMUNITY)
Admission: RE | Admit: 2012-02-09 | Discharge: 2012-02-09 | Disposition: A | Discharge status: Home / Self Care | Payer: Medicaid Other | Source: Ambulatory Visit | Attending: Oral Surgery | Admitting: Oral Surgery

## 2012-02-09 ENCOUNTER — Emergency Department (HOSPITAL_COMMUNITY): Payer: Medicaid Other

## 2012-02-09 ENCOUNTER — Emergency Department (HOSPITAL_COMMUNITY)
Admission: EM | Admit: 2012-02-09 | Discharge: 2012-02-09 | Disposition: A | Discharge status: Home / Self Care | Payer: Medicaid Other | Attending: Emergency Medicine | Admitting: Emergency Medicine

## 2012-02-09 ENCOUNTER — Encounter (HOSPITAL_COMMUNITY): Payer: Self-pay

## 2012-02-09 DIAGNOSIS — M129 Arthropathy, unspecified: Secondary | ICD-10-CM | POA: Insufficient documentation

## 2012-02-09 DIAGNOSIS — Z79899 Other long term (current) drug therapy: Secondary | ICD-10-CM | POA: Insufficient documentation

## 2012-02-09 DIAGNOSIS — F411 Generalized anxiety disorder: Secondary | ICD-10-CM | POA: Insufficient documentation

## 2012-02-09 DIAGNOSIS — F17200 Nicotine dependence, unspecified, uncomplicated: Secondary | ICD-10-CM | POA: Insufficient documentation

## 2012-02-09 DIAGNOSIS — R1032 Left lower quadrant pain: Secondary | ICD-10-CM

## 2012-02-09 DIAGNOSIS — Z87898 Personal history of other specified conditions: Secondary | ICD-10-CM | POA: Insufficient documentation

## 2012-02-09 DIAGNOSIS — F259 Schizoaffective disorder, unspecified: Secondary | ICD-10-CM | POA: Insufficient documentation

## 2012-02-09 DIAGNOSIS — F329 Major depressive disorder, single episode, unspecified: Secondary | ICD-10-CM | POA: Insufficient documentation

## 2012-02-09 DIAGNOSIS — K5900 Constipation, unspecified: Secondary | ICD-10-CM | POA: Insufficient documentation

## 2012-02-09 DIAGNOSIS — F3289 Other specified depressive episodes: Secondary | ICD-10-CM | POA: Insufficient documentation

## 2012-02-09 HISTORY — DX: Fracture of unspecified shoulder girdle, part unspecified, initial encounter for closed fracture: S42.90XA

## 2012-02-09 HISTORY — DX: Fracture of unspecified part of body of mandible, unspecified side, initial encounter for open fracture: S02.600B

## 2012-02-09 HISTORY — DX: Nausea: R11.0

## 2012-02-09 HISTORY — DX: Unspecified osteoarthritis, unspecified site: M19.90

## 2012-02-09 LAB — BASIC METABOLIC PANEL
BUN: 9 mg/dL (ref 6–23)
Chloride: 101 mEq/L (ref 96–112)
Creatinine, Ser: 0.61 mg/dL (ref 0.50–1.10)
GFR calc Af Amer: >90 mL/min (ref >90)
GFR calc non Af Amer: >90 mL/min (ref >90)
Potassium: 4.4 mEq/L (ref 3.5–5.1)

## 2012-02-09 LAB — URINALYSIS, ROUTINE W REFLEX MICROSCOPIC
Bilirubin Urine: NEGATIVE
Ketones, ur: NEGATIVE mg/dL
Leukocytes, UA: NEGATIVE
Nitrite: NEGATIVE
Protein, ur: NEGATIVE mg/dL
Urobilinogen, UA: 1 mg/dL (ref 0.0–1.0)

## 2012-02-09 LAB — COMPREHENSIVE METABOLIC PANEL
Alkaline Phosphatase: 65 U/L (ref 39–117)
BUN: 9 mg/dL (ref 6–23)
CO2: 26 mEq/L (ref 19–32)
Chloride: 103 mEq/L (ref 96–112)
GFR calc Af Amer: >90 mL/min (ref >90)
GFR calc non Af Amer: >90 mL/min (ref >90)
Glucose, Bld: 113 mg/dL — ABNORMAL HIGH (ref 70–99)
Potassium: 5 mEq/L (ref 3.5–5.1)
Total Bilirubin: 0.2 mg/dL — ABNORMAL LOW (ref 0.3–1.2)
Total Protein: 7 g/dL (ref 6.0–8.3)

## 2012-02-09 LAB — CBC WITH DIFFERENTIAL/PLATELET
Basophils Absolute: 0 10*3/uL (ref 0.0–0.1)
Basophils Relative: 0 % (ref 0–1)
Eosinophils Absolute: 0.1 10*3/uL (ref 0.0–0.7)
MCH: 30.1 pg (ref 26.0–34.0)
MCHC: 33.3 g/dL (ref 30.0–36.0)
Neutro Abs: 9.6 10*3/uL — ABNORMAL HIGH (ref 1.7–7.7)
Neutrophils Relative %: 84 % — ABNORMAL HIGH (ref 43–77)
RDW: 12.6 % (ref 11.5–15.5)

## 2012-02-09 LAB — URINE MICROSCOPIC-ADD ON

## 2012-02-09 LAB — HCG, SERUM, QUALITATIVE: Preg, Serum: NEGATIVE

## 2012-02-09 LAB — LIPASE, BLOOD: Lipase: 19 U/L (ref 11–59)

## 2012-02-09 LAB — CBC
HCT: 43.6 % (ref 36.0–46.0)
Hemoglobin: 14.7 g/dL (ref 12.0–15.0)
MCHC: 33.7 g/dL (ref 30.0–36.0)
RBC: 4.75 MIL/uL (ref 3.87–5.11)

## 2012-02-09 MED ORDER — POLYETHYLENE GLYCOL 3350 17 GM/SCOOP PO POWD: 17.0000 g | Freq: Two times a day (BID) | ORAL | Status: DC

## 2012-02-09 MED ORDER — HYDROMORPHONE HCL PF 1 MG/ML IJ SOLN: 1.0000 mg | INTRAMUSCULAR | Status: DC

## 2012-02-09 MED ORDER — ONDANSETRON HCL 4 MG/2ML IJ SOLN: 4.0000 mg | Freq: Once | INTRAMUSCULAR | Status: DC

## 2012-02-09 MED ORDER — BISACODYL 5 MG PO TBEC: 5.0000 mg | DELAYED_RELEASE_TABLET | Freq: Two times a day (BID) | ORAL | Status: DC

## 2012-02-09 MED ORDER — ONDANSETRON 4 MG PO TBDP
4.0000 mg | ORAL_TABLET | Freq: Once | ORAL | Status: AC
  Administered 2012-02-09: 4 mg via ORAL
  Filled 2012-02-09: qty 1

## 2012-02-09 MED ORDER — HYDROMORPHONE HCL PF 1 MG/ML IJ SOLN
1.0000 mg | Freq: Once | INTRAMUSCULAR | Status: AC
  Administered 2012-02-09: 1 mg via INTRAMUSCULAR

## 2012-02-09 MED ORDER — HYDROCODONE-ACETAMINOPHEN 5-325 MG PO TABS: 1.0000 | ORAL_TABLET | ORAL | Status: DC | PRN

## 2012-02-09 MED ORDER — HYDROMORPHONE HCL PF 1 MG/ML IJ SOLN
0.5000 mg | Freq: Once | INTRAMUSCULAR | Status: DC
  Filled 2012-02-09: qty 1

## 2012-02-09 NOTE — Progress Notes (Signed)
1416.. Missed the PAT Appt.---  I called the home number listed and gentleman told me Brittany Rios was visiting her mother in the hospital.  I then tried the cell phone, and it was 'disconnected and no longer in service"...DA

## 2012-02-09 NOTE — ED Notes (Signed)
Alert, NAD, calm, interactive, out in w/c to d/c desk, ouut with other (visitor).

## 2012-02-09 NOTE — ED Notes (Signed)
Pt c/o lower abd pain with N/V/D starting today

## 2012-02-09 NOTE — ED Notes (Signed)
Back from xray

## 2012-02-09 NOTE — ED Notes (Signed)
Pt in xray

## 2012-02-09 NOTE — ED Notes (Signed)
Pt here for diffuse abd pain and nv. Vomited x1 today. No diarrhea in last week. Last BM 1 week ago. Last ate "last night" (>24 hrs ago).  Tried pepto bismol today, denies taking other meds for sx. (denies: diarrhea, back pain, urinary or vaginal sx). Currently having her period. Describes flow as normal, but "feels worse".  Alert, NAD, calm, interactive, skin W&D, resps e/u, speaking in clear complete sentences,

## 2012-02-09 NOTE — Pre-Procedure Instructions (Signed)
20 Brittany Rios  02/09/2012   Your procedure is scheduled on: Monday, November 18th   Report to [facility] at 5:30 AM.  Call this number if you have problems the morning of surgery: [phone]   Remember:   Do not eat food or drink any liquids:After Midnight Sunday.    Take these medicines the morning of surgery with A SIP OF WATER: Celexa, Klonopin, Norco    Do not wear jewelry, make-up or nail polish.  Do not wear lotions, powders, or perfumes. You may  NOT wear deodorant.   Do not shave 48 hours prior to surgery.    Do not bring valuables to the hospital.  Contacts, dentures or bridgework may not be worn into surgery.   Leave suitcase in the car. After surgery it may be brought to your room.  For patients admitted to the hospital, checkout time is 11:00 AM the day of discharge.   Patients discharged the day of surgery will not be allowed to drive home.   Name and phone number of your driver:    Special Instructions: Shower using CHG 2 nights before surgery and the night before surgery.  If you shower the day of surgery use CHG.  Use special wash - you have one bottle of CHG for all showers.  You should use approximately 1/3 of the bottle for each shower.   Please read over the following fact sheets that you were given: Pain Booklet
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
## 2012-02-10 NOTE — Progress Notes (Signed)
CAll to Dr. Barbette Merino, left voicemail to fax results of visit with pt. Today regarding approach, for purpose of whether she will need PCR screen dos.

## 2012-02-10 NOTE — Progress Notes (Signed)
Pt. Has schedule / planned visit with Dr. Barbette Merino today. Yesterday it was undetermine as to whether a nasal PCR swab is needed for preop. Will determine need for swab based on approach for surgery.
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
## 2012-02-10 NOTE — Progress Notes (Signed)
Pt's family stopped by thinking pt had another appt in Short Stay today. Stated pt in ER all night and will not make appt today. Asked them to contact Dr. Randa Evens office as appt is scheduled with him.

## 2012-02-14 ENCOUNTER — Encounter (HOSPITAL_COMMUNITY): Payer: Self-pay | Admitting: Vascular Surgery

## 2012-02-14 ENCOUNTER — Ambulatory Visit (HOSPITAL_COMMUNITY): Payer: Medicaid Other | Admitting: Vascular Surgery

## 2012-02-14 ENCOUNTER — Encounter (HOSPITAL_COMMUNITY): Payer: Self-pay | Admitting: Surgery

## 2012-02-14 ENCOUNTER — Ambulatory Visit (HOSPITAL_COMMUNITY)
Admission: RE | Admit: 2012-02-14 | Discharge: 2012-02-14 | Disposition: A | Discharge status: Home / Self Care | Payer: Medicaid Other | Source: Ambulatory Visit | Attending: Oral Surgery | Admitting: Oral Surgery

## 2012-02-14 ENCOUNTER — Encounter (HOSPITAL_COMMUNITY)
Admission: RE | Disposition: A | Discharge status: Home / Self Care | Payer: Self-pay | Source: Ambulatory Visit | Attending: Oral Surgery

## 2012-02-14 DIAGNOSIS — D165 Benign neoplasm of lower jaw bone: Secondary | ICD-10-CM | POA: Insufficient documentation

## 2012-02-14 DIAGNOSIS — M272 Inflammatory conditions of jaws: Secondary | ICD-10-CM | POA: Insufficient documentation

## 2012-02-14 DIAGNOSIS — M270 Developmental disorders of jaws: Secondary | ICD-10-CM

## 2012-02-14 DIAGNOSIS — M278 Other specified diseases of jaws: Secondary | ICD-10-CM

## 2012-02-14 DIAGNOSIS — F259 Schizoaffective disorder, unspecified: Secondary | ICD-10-CM | POA: Insufficient documentation

## 2012-02-14 DIAGNOSIS — G8929 Other chronic pain: Secondary | ICD-10-CM

## 2012-02-14 DIAGNOSIS — F4310 Post-traumatic stress disorder, unspecified: Secondary | ICD-10-CM | POA: Insufficient documentation

## 2012-02-14 DIAGNOSIS — F329 Major depressive disorder, single episode, unspecified: Secondary | ICD-10-CM

## 2012-02-14 HISTORY — PX: MANDIBLE RECONSTRUCTION: SHX431

## 2012-02-14 LAB — SURGICAL PCR SCREEN: MRSA, PCR: NEGATIVE

## 2012-02-14 SURGERY — AUGMENTATION/RECONSTRUCTION MANDIBLE
Anesthesia: General | Site: Mouth | Laterality: Left | Wound class: Clean Contaminated

## 2012-02-14 MED ORDER — OXYCODONE-ACETAMINOPHEN 5-325 MG PO TABS: 1.0000 | ORAL_TABLET | ORAL | Status: DC | PRN

## 2012-02-14 MED ORDER — LACTATED RINGERS IV SOLN
INTRAVENOUS | Status: DC
  Administered 2012-02-14: 08:00:00 via INTRAVENOUS

## 2012-02-14 MED ORDER — OXYCODONE HCL 5 MG PO TABS
5.0000 mg | ORAL_TABLET | Freq: Once | ORAL | Status: DC | PRN
Start: 2012-02-14 — End: 2012-02-14

## 2012-02-14 MED ORDER — ONDANSETRON HCL 4 MG/2ML IJ SOLN
INTRAMUSCULAR | Status: DC | PRN
  Administered 2012-02-14: 4 mg via INTRAVENOUS

## 2012-02-14 MED ORDER — MEPERIDINE HCL 25 MG/ML IJ SOLN: 6.2500 mg | INTRAMUSCULAR | Status: DC | PRN

## 2012-02-14 MED ORDER — ONDANSETRON HCL 4 MG/2ML IJ SOLN: 4.0000 mg | Freq: Once | INTRAMUSCULAR | Status: DC | PRN

## 2012-02-14 MED ORDER — SODIUM CHLORIDE 0.9 % IR SOLN
Status: DC | PRN
  Administered 2012-02-14: 1

## 2012-02-14 MED ORDER — LIDOCAINE-EPINEPHRINE 2 %-1:100000 IJ SOLN
INTRAMUSCULAR | Status: AC
  Filled 2012-02-14: qty 1

## 2012-02-14 MED ORDER — HYDROMORPHONE HCL PF 1 MG/ML IJ SOLN
0.2500 mg | INTRAMUSCULAR | Status: DC | PRN
  Administered 2012-02-14 (×2): 0.5 mg via INTRAVENOUS

## 2012-02-14 MED ORDER — PROPOFOL 10 MG/ML IV BOLUS
INTRAVENOUS | Status: DC | PRN
  Administered 2012-02-14: 150 mg via INTRAVENOUS

## 2012-02-14 MED ORDER — SUCCINYLCHOLINE CHLORIDE 20 MG/ML IJ SOLN
INTRAMUSCULAR | Status: DC | PRN
  Administered 2012-02-14: 100 mg via INTRAVENOUS

## 2012-02-14 MED ORDER — OXYCODONE HCL 5 MG/5ML PO SOLN: 5.0000 mg | Freq: Once | ORAL | Status: DC | PRN

## 2012-02-14 MED ORDER — LIDOCAINE-EPINEPHRINE 2 %-1:100000 IJ SOLN
INTRAMUSCULAR | Status: DC | PRN
  Administered 2012-02-14: 10 mL

## 2012-02-14 MED ORDER — HYDROMORPHONE HCL PF 1 MG/ML IJ SOLN
INTRAMUSCULAR | Status: AC
  Filled 2012-02-14: qty 1

## 2012-02-14 MED ORDER — CEFAZOLIN SODIUM-DEXTROSE 2-3 GM-% IV SOLR
INTRAVENOUS | Status: DC | PRN
  Administered 2012-02-14: 2 g via INTRAVENOUS

## 2012-02-14 MED ORDER — FENTANYL CITRATE 0.05 MG/ML IJ SOLN
INTRAMUSCULAR | Status: DC | PRN
  Administered 2012-02-14: 50 ug via INTRAVENOUS
  Administered 2012-02-14: 150 ug via INTRAVENOUS

## 2012-02-14 MED ORDER — OXYMETAZOLINE HCL 0.05 % NA SOLN
NASAL | Status: AC
  Filled 2012-02-14: qty 15

## 2012-02-14 MED ORDER — CEFAZOLIN SODIUM 1-5 GM-% IV SOLN
INTRAVENOUS | Status: AC
  Filled 2012-02-14: qty 50

## 2012-02-14 MED ORDER — MUPIROCIN 2 % EX OINT
TOPICAL_OINTMENT | CUTANEOUS | Status: AC
  Administered 2012-02-14: 1 via NASAL
  Filled 2012-02-14: qty 22

## 2012-02-14 SURGICAL SUPPLY — 27 items
BUR CROSS CUT FISSURE 1.6 (BURR) ×3 IMPLANT
BUR EGG ELITE 4.0 (BURR) ×3 IMPLANT
CANISTER SUCTION 2500CC (MISCELLANEOUS) ×3 IMPLANT
CLOTH BEACON ORANGE TIMEOUT ST (SAFETY) ×3 IMPLANT
COVER SURGICAL LIGHT HANDLE (MISCELLANEOUS) ×3 IMPLANT
CRADLE DONUT ADULT HEAD (MISCELLANEOUS) ×3 IMPLANT
DECANTER SPIKE VIAL GLASS SM (MISCELLANEOUS) ×3 IMPLANT
GAUZE PACKING FOLDED 2  STR (GAUZE/BANDAGES/DRESSINGS) ×1
GAUZE PACKING FOLDED 2 STR (GAUZE/BANDAGES/DRESSINGS) ×2 IMPLANT
GLOVE BIO SURGEON STRL SZ 6.5 (GLOVE) ×3 IMPLANT
GLOVE BIO SURGEON STRL SZ7.5 (GLOVE) ×3 IMPLANT
GLOVE BIOGEL PI IND STRL 7.0 (GLOVE) ×2 IMPLANT
GLOVE BIOGEL PI INDICATOR 7.0 (GLOVE) ×1
GOWN STRL NON-REIN LRG LVL3 (GOWN DISPOSABLE) ×3 IMPLANT
GOWN STRL REIN XL XLG (GOWN DISPOSABLE) ×3 IMPLANT
KIT BASIN OR (CUSTOM PROCEDURE TRAY) ×3 IMPLANT
KIT ROOM TURNOVER OR (KITS) ×3 IMPLANT
NEEDLE 22X1 1/2 (OR ONLY) (NEEDLE) ×3 IMPLANT
NS IRRIG 1000ML POUR BTL (IV SOLUTION) ×3 IMPLANT
PAD ARMBOARD 7.5X6 YLW CONV (MISCELLANEOUS) ×6 IMPLANT
SUT CHROMIC 3 0 PS 2 (SUTURE) ×6 IMPLANT
SYR CONTROL 10ML LL (SYRINGE) ×3 IMPLANT
TOWEL OR 17X26 10 PK STRL BLUE (TOWEL DISPOSABLE) ×3 IMPLANT
TRAY ENT MC OR (CUSTOM PROCEDURE TRAY) ×3 IMPLANT
TUBING IRRIGATION (MISCELLANEOUS) IMPLANT
WATER STERILE IRR 1000ML POUR (IV SOLUTION) IMPLANT
YANKAUER SUCT BULB TIP NO VENT (SUCTIONS) ×3 IMPLANT

## 2012-02-14 NOTE — Anesthesia Preprocedure Evaluation (Signed)
Anesthesia Evaluation  Patient identified by MRN, date of birth, ID band Patient awake    Reviewed: Allergy & Precautions, H&P , NPO status , Patient's Chart, lab work & pertinent test results  Airway Mallampati: I TM Distance: >3 FB Neck ROM: Full    Dental   Pulmonary          Cardiovascular     Neuro/Psych Anxiety Depression    GI/Hepatic   Endo/Other    Renal/GU      Musculoskeletal   Abdominal   Peds  Hematology   Anesthesia Other Findings   Reproductive/Obstetrics                           Anesthesia Physical Anesthesia Plan  ASA: II  Anesthesia Plan: General   Post-op Pain Management:    Induction: Intravenous  Airway Management Planned: Nasal ETT  Additional Equipment:   Intra-op Plan:   Post-operative Plan: Extubation in OR  Informed Consent: I have reviewed the patients History and Physical, chart, labs and discussed the procedure including the risks, benefits and alternatives for the proposed anesthesia with the patient or authorized representative who has indicated his/her understanding and acceptance.     Plan Discussed with: CRNA and Surgeon  Anesthesia Plan Comments:         Anesthesia Quick Evaluation

## 2012-02-14 NOTE — Anesthesia Postprocedure Evaluation (Signed)
Anesthesia Post Note  Patient: Brittany Rios  Procedure(s) Performed: Procedure(s) (LRB): AUGMENTATION/RECONSTRUCTION MANDIBLE (Left)  Anesthesia type: general  Patient location: PACU  Post pain: Pain level controlled  Post assessment: Patient's Cardiovascular Status Stable  Last Vitals:  Filed Vitals:   02/14/12 1035  BP: 119/79  Pulse: 80  Temp: 37.1 C  Resp: 16    Post vital signs: Reviewed and stable  Level of consciousness: sedated  Complications: No apparent anesthesia complications
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
## 2012-02-14 NOTE — Progress Notes (Signed)
Pt admitted to Short stay.  Denies SOB, chest pain.  Color pink. May proceed on w/ procedure.

## 2012-02-14 NOTE — Preoperative (Signed)
Beta Blockers   Reason not to administer Beta Blockers:Not Applicable 

## 2012-02-14 NOTE — Op Note (Signed)
02/14/2012  9:37 AM  PATIENT:  Brittany Rios  36 y.o. female  PRE-OPERATIVE DIAGNOSIS:  LEFT MANDIBLE FRACTURE NONUNION, SEQUESTRUM  POST-OPERATIVE DIAGNOSIS:  LEFT MANDIBLE LINGUAL TORUS, BUCCAL EXOSTOSIS  PROCEDURE:  Procedure(s): EXPLORATION LEFT MANDIBLE FRACTURE, REMOVAL LEFT MANDIBULAR LINGUAL TORUS, REMOVAL LEFT MANDIBULAR BUCCAL EXOSTOSIS  SURGEON:  Surgeon(s): Georgia Lopes, DDS  ANESTHESIA:   local and general  EBL:  minimal  DRAINS: none   SPECIMEN:  No Specimen  COUNTS:  YES  PLAN OF CARE: Discharge to home after PACU  PATIENT DISPOSITION:  PACU - hemodynamically stable.   PROCEDURE DETAILS: Dictation # [id number]  Georgia Lopes, DMD 02/14/2012 9:37 AM

## 2012-02-14 NOTE — Transfer of Care (Signed)
Immediate Anesthesia Transfer of Care Note  Patient: Brittany Rios  Procedure(s) Performed: Procedure(s) (LRB) with comments: AUGMENTATION/RECONSTRUCTION MANDIBLE (Left) - Exploration Left Mandible; Removal Mandible Torus; Buccal Exostosis  Patient Location: PACU  Anesthesia Type:General  Level of Consciousness: awake, alert  and oriented  Airway & Oxygen Therapy: Patient Spontanous Breathing and Patient connected to nasal cannula oxygen  Post-op Assessment: Report given to PACU RN and Post -op Vital signs reviewed and stable  Post vital signs: Reviewed and stable  Complications: No apparent anesthesia complications

## 2012-02-14 NOTE — Anesthesia Procedure Notes (Signed)
Procedure Name: Intubation Date/Time: 02/14/2012 9:13 AM Performed by: Gwenyth Allegra Pre-anesthesia Checklist: Patient identified, Timeout performed, Emergency Drugs available, Suction available and Patient being monitored Patient Re-evaluated:Patient Re-evaluated prior to inductionOxygen Delivery Method: Circle system utilized Intubation Type: IV induction Ventilation: Mask ventilation without difficulty Laryngoscope Size: Mac and 3 Grade View: Grade I Tube type: Oral Tube size: 7.0 mm Number of attempts: 1 Placement Confirmation: ETT inserted through vocal cords under direct vision,  breath sounds checked- equal and bilateral and positive ETCO2 Secured at: 21 cm Tube secured with: Tape Dental Injury: Teeth and Oropharynx as per pre-operative assessment

## 2012-02-15 ENCOUNTER — Encounter (HOSPITAL_COMMUNITY): Payer: Self-pay | Admitting: Oral Surgery
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
## 2012-03-04 ENCOUNTER — Emergency Department (HOSPITAL_COMMUNITY): Payer: Medicaid Other

## 2012-03-04 ENCOUNTER — Encounter (HOSPITAL_COMMUNITY): Payer: Self-pay | Admitting: *Deleted

## 2012-03-04 ENCOUNTER — Emergency Department (HOSPITAL_COMMUNITY)
Admission: EM | Admit: 2012-03-04 | Discharge: 2012-03-04 | Disposition: A | Discharge status: Home / Self Care | Payer: Medicaid Other | Attending: Emergency Medicine | Admitting: Emergency Medicine

## 2012-03-04 DIAGNOSIS — F17200 Nicotine dependence, unspecified, uncomplicated: Secondary | ICD-10-CM | POA: Insufficient documentation

## 2012-03-04 DIAGNOSIS — M129 Arthropathy, unspecified: Secondary | ICD-10-CM | POA: Insufficient documentation

## 2012-03-04 DIAGNOSIS — Z8781 Personal history of (healed) traumatic fracture: Secondary | ICD-10-CM | POA: Insufficient documentation

## 2012-03-04 DIAGNOSIS — F259 Schizoaffective disorder, unspecified: Secondary | ICD-10-CM | POA: Insufficient documentation

## 2012-03-04 DIAGNOSIS — F411 Generalized anxiety disorder: Secondary | ICD-10-CM | POA: Insufficient documentation

## 2012-03-04 DIAGNOSIS — Z79899 Other long term (current) drug therapy: Secondary | ICD-10-CM | POA: Insufficient documentation

## 2012-03-04 DIAGNOSIS — F3289 Other specified depressive episodes: Secondary | ICD-10-CM | POA: Insufficient documentation

## 2012-03-04 DIAGNOSIS — G479 Sleep disorder, unspecified: Secondary | ICD-10-CM | POA: Insufficient documentation

## 2012-03-04 DIAGNOSIS — M48061 Spinal stenosis, lumbar region without neurogenic claudication: Secondary | ICD-10-CM

## 2012-03-04 DIAGNOSIS — F329 Major depressive disorder, single episode, unspecified: Secondary | ICD-10-CM | POA: Insufficient documentation

## 2012-03-04 MED ORDER — METHOCARBAMOL 500 MG PO TABS
1000.0000 mg | ORAL_TABLET | Freq: Once | ORAL | Status: AC
  Administered 2012-03-04: 1000 mg via ORAL
  Filled 2012-03-04: qty 1

## 2012-03-04 MED ORDER — HYDROCODONE-ACETAMINOPHEN 5-325 MG PO TABS: ORAL_TABLET | ORAL | Status: DC

## 2012-03-04 MED ORDER — DEXAMETHASONE 6 MG PO TABS: ORAL_TABLET | ORAL | Status: DC

## 2012-03-04 MED ORDER — HYDROCODONE-ACETAMINOPHEN 7.5-325 MG PO TABS: 1.0000 | ORAL_TABLET | ORAL | Status: DC | PRN

## 2012-03-04 MED ORDER — METHOCARBAMOL 500 MG PO TABS: ORAL_TABLET | ORAL | Status: DC

## 2012-03-04 MED ORDER — ONDANSETRON HCL 4 MG PO TABS
4.0000 mg | ORAL_TABLET | Freq: Once | ORAL | Status: AC
  Administered 2012-03-04: 4 mg via ORAL
  Filled 2012-03-04: qty 1

## 2012-03-04 MED ORDER — MORPHINE SULFATE 4 MG/ML IJ SOLN
8.0000 mg | Freq: Once | INTRAMUSCULAR | Status: AC
  Administered 2012-03-04: 8 mg via INTRAMUSCULAR
  Filled 2012-03-04: qty 2

## 2012-03-04 MED ORDER — DEXAMETHASONE SODIUM PHOSPHATE 4 MG/ML IJ SOLN
8.0000 mg | Freq: Once | INTRAMUSCULAR | Status: AC
  Administered 2012-03-04: 8 mg via INTRAMUSCULAR
  Filled 2012-03-04: qty 2
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
## 2012-03-04 NOTE — ED Notes (Signed)
Discharge instructions given and reviewed with patient.  Prescriptions given for Decadron, Hydrocodone and Robaxin; effects and use explained for each.  Hydrocodone pre-pack given as instructed.  Patient verbalized understanding of sedating effects of Robaxin and Hydrocodone.  Patient ambulatory; discharged home in good condition.

## 2012-03-04 NOTE — ED Provider Notes (Signed)
Medical screening examination/treatment/procedure(s) were performed by non-physician practitioner and as supervising physician I was immediately available for consultation/collaboration.  Shelda Jakes, MD 03/04/12 [phone]

## 2012-03-06 MED FILL — Hydrocodone-Acetaminophen Tab 5-325 MG: ORAL | Qty: 6 | Status: AC

## 2015-08-14 ENCOUNTER — Ambulatory Visit (INDEPENDENT_AMBULATORY_CARE_PROVIDER_SITE_OTHER): Payer: Medicaid Other | Admitting: Family Medicine

## 2015-08-14 ENCOUNTER — Encounter: Payer: Self-pay | Admitting: Family Medicine

## 2015-08-14 ENCOUNTER — Encounter (INDEPENDENT_AMBULATORY_CARE_PROVIDER_SITE_OTHER): Payer: Self-pay

## 2015-08-14 ENCOUNTER — Encounter: Payer: Self-pay | Admitting: *Deleted

## 2015-08-14 VITALS — BP 133/86 | HR 95 | Temp 98.7°F | Ht 70.0 in | Wt 173.4 lb

## 2015-08-14 DIAGNOSIS — I10 Essential (primary) hypertension: Secondary | ICD-10-CM | POA: Diagnosis not present

## 2015-08-14 DIAGNOSIS — M5136 Other intervertebral disc degeneration, lumbar region: Secondary | ICD-10-CM | POA: Insufficient documentation

## 2015-08-14 DIAGNOSIS — F329 Major depressive disorder, single episode, unspecified: Secondary | ICD-10-CM

## 2015-08-14 DIAGNOSIS — F4310 Post-traumatic stress disorder, unspecified: Secondary | ICD-10-CM

## 2015-08-14 DIAGNOSIS — F32A Depression, unspecified: Secondary | ICD-10-CM

## 2015-08-14 MED ORDER — DULOXETINE HCL 60 MG PO CPEP: 60.0000 mg | ORAL_CAPSULE | Freq: Every day | ORAL | Status: DC

## 2015-08-14 MED ORDER — ALBUTEROL SULFATE HFA 108 (90 BASE) MCG/ACT IN AERS: 2.0000 | INHALATION_SPRAY | Freq: Four times a day (QID) | RESPIRATORY_TRACT | Status: DC | PRN
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
## 2015-09-05 ENCOUNTER — Ambulatory Visit (INDEPENDENT_AMBULATORY_CARE_PROVIDER_SITE_OTHER): Payer: Medicaid Other | Admitting: Family Medicine

## 2015-09-05 ENCOUNTER — Encounter: Payer: Self-pay | Admitting: Family Medicine

## 2015-09-05 VITALS — BP 131/89 | HR 76 | Temp 97.6°F | Ht 70.0 in | Wt 171.2 lb

## 2015-09-05 DIAGNOSIS — S60943A Unspecified superficial injury of left middle finger, initial encounter: Secondary | ICD-10-CM

## 2015-09-05 DIAGNOSIS — T148XXA Other injury of unspecified body region, initial encounter: Secondary | ICD-10-CM

## 2015-09-05 NOTE — Progress Notes (Signed)
BP 131/89 mmHg  Pulse 76  Temp(Src) 97.6 F (36.4 C) (Oral)  Ht 5\' 10"  (1.778 m)  Wt 171 lb 3.2 oz (77.656 kg)  BMI 24.56 kg/m2  LMP 08/31/2015 (Approximate)   Subjective:    Patient ID: Brittany Rios, female    DOB: 11-06-1975, 40 y.o.   MRN: [id number]  HPI: Brittany Rios is a 40 y.o. female presenting on 09/05/2015 for Pain and swelling in 4th digit of left hand   HPI Pain in middle of left ring finger Patient has pain and swelling over the middle phalanx on the palmar side of the left hand. This started 2 days ago. She had one previous episode where she got a blood clot in that finger 20 years ago and she is concerned about whether that could be this. She is also concerned about whether or not this blood clot could travel anywhere and cause any major issues. She denies any fevers or chills or overlying erythema or warmth.  Relevant past medical, surgical, family and social history reviewed and updated as indicated. Interim medical history since our last visit reviewed. Allergies and medications reviewed and updated.  Review of Systems  Constitutional: Negative for fever and chills.  HENT: Negative for congestion, ear discharge and ear pain.   Eyes: Negative for redness and visual disturbance.  Respiratory: Negative for chest tightness and shortness of breath.   Cardiovascular: Negative for chest pain and leg swelling.  Genitourinary: Negative for dysuria and difficulty urinating.  Musculoskeletal: Positive for myalgias, joint swelling and arthralgias. Negative for back pain and gait problem.  Skin: Negative for rash.  Neurological: Negative for light-headedness and headaches.  Psychiatric/Behavioral: Negative for behavioral problems and agitation.  All other systems reviewed and are negative.   Per HPI unless specifically indicated above     Medication List       This list is accurate as of: 09/05/15  3:36 PM.  Always use your most recent med list.               albuterol 108 (90 Base) MCG/ACT inhaler  Commonly known as:  PROVENTIL HFA;VENTOLIN HFA  Inhale 2 puffs into the lungs every 6 (six) hours as needed for wheezing or shortness of breath.     DULoxetine 60 MG capsule  Commonly known as:  CYMBALTA  Take 1 capsule (60 mg total) by mouth daily.           Objective:    BP 131/89 mmHg  Pulse 76  Temp(Src) 97.6 F (36.4 C) (Oral)  Ht 5\' 10"  (1.778 m)  Wt 171 lb 3.2 oz (77.656 kg)  BMI 24.56 kg/m2  LMP 08/31/2015 (Approximate)  Wt Readings from Last 3 Encounters:  09/05/15 171 lb 3.2 oz (77.656 kg)  08/14/15 173 lb 6.4 oz (78.654 kg)  02/09/12 145 lb 6.4 oz (65.953 kg)    Physical Exam  Constitutional: She is oriented to person, place, and time. She appears well-developed and well-nourished. No distress.  Eyes: Conjunctivae and EOM are normal. Pupils are equal, round, and reactive to light.  Cardiovascular: Normal rate, regular rhythm, normal heart sounds and intact distal pulses.   No murmur heard. Pulmonary/Chest: Effort normal and breath sounds normal. No respiratory distress. She has no wheezes.  Musculoskeletal: Normal range of motion. She exhibits tenderness. She exhibits no edema.       Hands: Neurological: She is alert and oriented to person, place, and time. Coordination normal.  Skin: Skin is warm and dry. No rash noted.  She is not diaphoretic.  Psychiatric: She has a normal mood and affect. Her behavior is normal.  Nursing note and vitals reviewed.       Assessment & Plan:   Problem List Items Addressed This Visit    None    Visit Diagnoses    Hematoma    -  Primary    Small hematoma in middle phalanx region of ring finger on left hand. Use heat and anti-inflammatories        Follow up plan: Return if symptoms worsen or fail to improve.  Counseling provided for all of the vaccine components No orders of the defined types were placed in this encounter.    Caryl Pina, MD [facility] 09/05/2015, 3:36 PM

## 2015-09-11 ENCOUNTER — Encounter: Payer: Medicaid Other | Admitting: Family Medicine

## 2015-09-12 ENCOUNTER — Encounter: Payer: Self-pay | Admitting: Family Medicine

## 2015-10-08 ENCOUNTER — Encounter: Payer: Self-pay | Admitting: Family Medicine

## 2015-10-08 ENCOUNTER — Ambulatory Visit (INDEPENDENT_AMBULATORY_CARE_PROVIDER_SITE_OTHER): Payer: Medicaid Other | Admitting: Family Medicine

## 2015-10-08 VITALS — BP 105/72 | HR 74 | Temp 97.8°F | Ht 70.0 in | Wt 171.0 lb

## 2015-10-08 DIAGNOSIS — F329 Major depressive disorder, single episode, unspecified: Secondary | ICD-10-CM | POA: Diagnosis not present

## 2015-10-08 DIAGNOSIS — M5136 Other intervertebral disc degeneration, lumbar region: Secondary | ICD-10-CM

## 2015-10-08 DIAGNOSIS — Z716 Tobacco abuse counseling: Secondary | ICD-10-CM | POA: Diagnosis not present

## 2015-10-08 DIAGNOSIS — Z720 Tobacco use: Secondary | ICD-10-CM | POA: Diagnosis not present

## 2015-10-08 DIAGNOSIS — F32A Depression, unspecified: Secondary | ICD-10-CM

## 2015-10-08 MED ORDER — TIZANIDINE HCL 4 MG PO TABS: 4.0000 mg | ORAL_TABLET | Freq: Three times a day (TID) | ORAL | Status: DC | PRN

## 2015-10-08 MED ORDER — TOPIRAMATE 25 MG PO TABS: 25.0000 mg | ORAL_TABLET | Freq: Two times a day (BID) | ORAL | Status: DC

## 2015-10-08 MED ORDER — VARENICLINE TARTRATE 0.5 MG X 11 & 1 MG X 42 PO MISC
ORAL | Status: DC
Start: 2015-10-08 — End: 2015-11-25

## 2015-10-08 NOTE — Patient Instructions (Signed)
Calorie Counting for Weight Loss Calories are energy you get from the things you eat and drink. Your body uses this energy to keep you going throughout the day. The number of calories you eat affects your weight. When you eat more calories than your body needs, your body stores the extra calories as fat. When you eat fewer calories than your body needs, your body burns fat to get the energy it needs. Calorie counting means keeping track of how many calories you eat and drink each day. If you make sure to eat fewer calories than your body needs, you should lose weight. In order for calorie counting to work, you will need to eat the number of calories that are right for you in a day to lose a healthy amount of weight per week. A healthy amount of weight to lose per week is usually 1-2 lb (0.5-0.9 kg). A dietitian can determine how many calories you need in a day and give you suggestions on how to reach your calorie goal.  WHAT IS MY MY PLAN? My goal is to have __________ calories per day.  If I have this many calories per day, I should lose around __________ pounds per week. WHAT DO I NEED TO KNOW ABOUT CALORIE COUNTING? In order to meet your daily calorie goal, you will need to:  Find out how many calories are in each food you would like to eat. Try to do this before you eat.  Decide how much of the food you can eat.  Write down what you ate and how many calories it had. Doing this is called keeping a food log. WHERE DO I FIND CALORIE INFORMATION? The number of calories in a food can be found on a Nutrition Facts label. Note that all the information on a label is based on a specific serving of the food. If a food does not have a Nutrition Facts label, try to look up the calories online or ask your dietitian for help. HOW DO I DECIDE HOW MUCH TO EAT? To decide how much of the food you can eat, you will need to consider both the number of calories in one serving and the size of one serving. This  information can be found on the Nutrition Facts label. If a food does not have a Nutrition Facts label, look up the information online or ask your dietitian for help. Remember that calories are listed per serving. If you choose to have more than one serving of a food, you will have to multiply the calories per serving by the amount of servings you plan to eat. For example, the label on a package of bread might say that a serving size is 1 slice and that there are 90 calories in a serving. If you eat 1 slice, you will have eaten 90 calories. If you eat 2 slices, you will have eaten 180 calories. HOW DO I KEEP A FOOD LOG? After each meal, record the following information in your food log:  What you ate.  How much of it you ate.  How many calories it had.  Then, add up your calories. Keep your food log near you, such as in a small notebook in your pocket. Another option is to use a mobile app or website. Some programs will calculate calories for you and show you how many calories you have left each time you add an item to the log. WHAT ARE SOME CALORIE COUNTING TIPS?  Use your calories on foods   and drinks that will fill you up and not leave you hungry. Some examples of this include foods like nuts and nut butters, vegetables, lean proteins, and high-fiber foods (more than 5 g fiber per serving).  Eat nutritious foods and avoid empty calories. Empty calories are calories you get from foods or beverages that do not have many nutrients, such as candy and soda. It is better to have a nutritious high-calorie food (such as an avocado) than a food with few nutrients (such as a bag of chips).  Know how many calories are in the foods you eat most often. This way, you do not have to look up how many calories they have each time you eat them.  Look out for foods that may seem like low-calorie foods but are really high-calorie foods, such as baked goods, soda, and fat-free candy.  Pay attention to calories  in drinks. Drinks such as sodas, specialty coffee drinks, alcohol, and juices have a lot of calories yet do not fill you up. Choose low-calorie drinks like water and diet drinks.  Focus your calorie counting efforts on higher calorie items. Logging the calories in a garden salad that contains only vegetables is less important than calculating the calories in a milk shake.  Find a way of tracking calories that works for you. Get creative. Most people who are successful find ways to keep track of how much they eat in a day, even if they do not count every calorie. WHAT ARE SOME PORTION CONTROL TIPS?  Know how many calories are in a serving. This will help you know how many servings of a certain food you can have.  Use a measuring cup to measure serving sizes. This is helpful when you start out. With time, you will be able to estimate serving sizes for some foods.  Take some time to put servings of different foods on your favorite plates, bowls, and cups so you know what a serving looks like.  Try not to eat straight from a bag or box. Doing this can lead to overeating. Put the amount you would like to eat in a cup or on a plate to make sure you are eating the right portion.  Use smaller plates, glasses, and bowls to prevent overeating. This is a quick and easy way to practice portion control. If your plate is smaller, less food can fit on it.  Try not to multitask while eating, such as watching TV or using your computer. If it is time to eat, sit down at a table and enjoy your food. Doing this will help you to start recognizing when you are full. It will also make you more aware of what and how much you are eating. HOW CAN I CALORIE COUNT WHEN EATING OUT?  Ask for smaller portion sizes or child-sized portions.  Consider sharing an entree and sides instead of getting your own entree.  If you get your own entree, eat only half. Ask for a box at the beginning of your meal and put the rest of your  entree in it so you are not tempted to eat it.  Look for the calories on the menu. If calories are listed, choose the lower calorie options.  Choose dishes that include vegetables, fruits, whole grains, low-fat dairy products, and lean protein. Focusing on smart food choices from each of the 5 food groups can help you stay on track at restaurants.  Choose items that are boiled, broiled, grilled, or steamed.  Choose   water, milk, unsweetened iced tea, or other drinks without added sugars. If you want an alcoholic beverage, choose a lower calorie option. For example, a regular margarita can have up to 700 calories and a glass of wine has around 150.  Stay away from items that are buttered, battered, fried, or served with cream sauce. Items labeled "crispy" are usually fried, unless stated otherwise.  Ask for dressings, sauces, and syrups on the side. These are usually very high in calories, so do not eat much of them.  Watch out for salads. Many people think salads are a healthy option, but this is often not the case. Many salads come with bacon, fried chicken, lots of cheese, fried chips, and dressing. All of these items have a lot of calories. If you want a salad, choose a garden salad and ask for grilled meats or steak. Ask for the dressing on the side, or ask for olive oil and vinegar or lemon to use as dressing.  Estimate how many servings of a food you are given. For example, a serving of cooked rice is  cup or about the size of half a tennis ball or one cupcake wrapper. Knowing serving sizes will help you be aware of how much food you are eating at restaurants. The list below tells you how big or small some common portion sizes are based on everyday objects.  1 oz--4 stacked dice.  3 oz--1 deck of cards.  1 tsp--1 dice.  1 Tbsp-- a Ping-Pong ball.  2 Tbsp--1 Ping-Pong ball.   cup--1 tennis ball or 1 cupcake wrapper.  1 cup--1 baseball.   This information is not intended to  replace advice given to you by your health care provider. Make sure you discuss any questions you have with your health care provider.   Document Released: 03/15/2005 Document Revised: 04/05/2014 Document Reviewed: 01/18/2013 Elsevier Interactive Patient Education 2016 [employer]  Exercising to Lose Weight Exercising can help you to lose weight. In order to lose weight through exercise, you need to do vigorous-intensity exercise. You can tell that you are exercising with vigorous intensity if you are breathing very hard and fast and cannot hold a conversation while exercising. Moderate-intensity exercise helps to maintain your current weight. You can tell that you are exercising at a moderate level if you have a higher heart rate and faster breathing, but you are still able to hold a conversation. HOW OFTEN SHOULD I EXERCISE? Choose an activity that you enjoy and set realistic goals. Your health care provider can help you to make an activity plan that works for you. Exercise regularly as directed by your health care provider. This may include:  Doing resistance training twice each week, such as:  Push-ups.  Sit-ups.  Lifting weights.  Using resistance bands.  Doing a given intensity of exercise for a given amount of time. Choose from these options:  150 minutes of moderate-intensity exercise every week.  75 minutes of vigorous-intensity exercise every week.  A mix of moderate-intensity and vigorous-intensity exercise every week. Children, pregnant women, people who are out of shape, people who are overweight, and older adults may need to consult a health care provider for individual recommendations. If you have any sort of medical condition, be sure to consult your health care provider before starting a new exercise program. WHAT ARE SOME ACTIVITIES THAT CAN HELP ME TO LOSE WEIGHT?   Walking at a rate of at least 4.5 miles an hour.  Jogging or running at a rate   of 5 miles per  hour.  Biking at a rate of at least 10 miles per hour.  Lap swimming.  Roller-skating or in-line skating.  Cross-country skiing.  Vigorous competitive sports, such as football, basketball, and soccer.  Jumping rope.  Aerobic dancing. HOW CAN I BE MORE ACTIVE IN MY DAY-TO-DAY ACTIVITIES?  Use the stairs instead of the elevator.  Take a walk during your lunch break.  If you drive, park your car farther away from work or school.  If you take public transportation, get off one stop early and walk the rest of the way.  Make all of your phone calls while standing up and walking around.  Get up, stretch, and walk around every 30 minutes throughout the day. WHAT GUIDELINES SHOULD I FOLLOW WHILE EXERCISING?  Do not exercise so much that you hurt yourself, feel dizzy, or get very short of breath.  Consult your health care provider prior to starting a new exercise program.  Wear comfortable clothes and shoes with good support.  Drink plenty of water while you exercise to prevent dehydration or heat stroke. Body water is lost during exercise and must be replaced.  Work out until you breathe faster and your heart beats faster.   This information is not intended to replace advice given to you by your health care provider. Make sure you discuss any questions you have with your health care provider.   Document Released: 04/17/2010 Document Revised: 04/05/2014 Document Reviewed: 08/16/2013 Elsevier Interactive Patient Education 2016 [employer] Inc.  

## 2015-10-08 NOTE — Progress Notes (Signed)
BP 105/72 mmHg  Pulse 74  Temp(Src) 97.8 F (36.6 C) (Oral)  Ht 5\' 10"  (1.778 m)  Wt 171 lb (77.565 kg)  BMI 24.54 kg/m2   Subjective:    Patient ID: Brittany Rios, female    DOB: 1975/05/10, 40 y.o.   MRN: [id number]  HPI: Brittany Rios is a 40 y.o. female presenting on 10/08/2015 for discuss weight loss   HPI Weight loss and mood stabilization Patient is coming in today because she has heard Topamax can help with her mood and anxiety and depression but can also help with her weight. She wants to have something to help her lose weight but she has a BMI of 24.5 so does not qualify for any weight loss medications necessarily. She denies any suicidal ideations or thoughts of hurting herself, she has been doing better on the Cymbalta with her mood but does feel like if she had some a little bit more that would be great. She is starting to sleep better at night. She is seen a Social worker for PTSD.  Smoking cessation Patient would like to try Chantix for smoking cessation. We discussed mood issues and or sleep and dream issues. Patient understands and wants to try anyways. Discussed that she has the hotline number for suicidal ideations just in case that arises.  Back pain and degenerative disc.  Patient says that she started to feel numbness and shooting from her back pain and feels ready to go see an orthopedic at this time. Has been many years since she has seen one.  Relevant past medical, surgical, family and social history reviewed and updated as indicated. Interim medical history since our last visit reviewed. Allergies and medications reviewed and updated.  Review of Systems  Constitutional: Negative for fever and chills.  HENT: Negative for congestion, ear discharge and ear pain.   Eyes: Negative for redness and visual disturbance.  Respiratory: Negative for chest tightness and shortness of breath.   Cardiovascular: Negative for chest pain and leg swelling.  Genitourinary: Negative  for dysuria and difficulty urinating.  Musculoskeletal: Positive for back pain. Negative for gait problem.  Skin: Negative for rash.  Neurological: Negative for light-headedness and headaches.  Psychiatric/Behavioral: Positive for sleep disturbance and dysphoric mood. Negative for suicidal ideas, behavioral problems, self-injury and agitation. The patient is nervous/anxious.   All other systems reviewed and are negative.   Per HPI unless specifically indicated above     Medication List       This list is accurate as of: 10/08/15 12:22 PM.  Always use your most recent med list.               albuterol 108 (90 Base) MCG/ACT inhaler  Commonly known as:  PROVENTIL HFA;VENTOLIN HFA  Inhale 2 puffs into the lungs every 6 (six) hours as needed for wheezing or shortness of breath.     buprenorphine 8 MG Subl SL tablet  Commonly known as:  SUBUTEX  8 mg 2 (two) times daily.     DULoxetine 60 MG capsule  Commonly known as:  CYMBALTA  Take 1 capsule (60 mg total) by mouth daily.     tiZANidine 4 MG tablet  Commonly known as:  ZANAFLEX  Take 1 tablet (4 mg total) by mouth every 8 (eight) hours as needed for muscle spasms.     topiramate 25 MG tablet  Commonly known as:  TOPAMAX  Take 1 tablet (25 mg total) by mouth 2 (two) times daily.  varenicline 0.5 MG X 11 & 1 MG X 42 tablet  Commonly known as:  CHANTIX STARTING MONTH PAK  Take one 0.5 mg tablet by mouth daily for 3 days, then one 0.5 mg tablet twice daily for 4 days, then one 1 mg tablet twice daily.           Objective:    BP 105/72 mmHg  Pulse 74  Temp(Src) 97.8 F (36.6 C) (Oral)  Ht 5\' 10"  (1.778 m)  Wt 171 lb (77.565 kg)  BMI 24.54 kg/m2  Wt Readings from Last 3 Encounters:  10/08/15 171 lb (77.565 kg)  09/05/15 171 lb 3.2 oz (77.656 kg)  08/14/15 173 lb 6.4 oz (78.654 kg)    Physical Exam  Constitutional: She is oriented to person, place, and time. She appears well-developed and well-nourished. No  distress.  Eyes: Conjunctivae and EOM are normal. Pupils are equal, round, and reactive to light.  Cardiovascular: Normal rate, regular rhythm, normal heart sounds and intact distal pulses.   No murmur heard. Pulmonary/Chest: Effort normal and breath sounds normal. No respiratory distress. She has no wheezes.  Musculoskeletal: Normal range of motion. She exhibits tenderness (Mid and low back pain. Negative straight leg raise bilaterally). She exhibits no edema.  Neurological: She is alert and oriented to person, place, and time. Coordination normal.  Skin: Skin is warm and dry. No rash noted. She is not diaphoretic.  Psychiatric: Her behavior is normal. Thought content normal. Her mood appears anxious. She exhibits a depressed mood. She expresses no suicidal ideation. She expresses no suicidal plans.  Nursing note and vitals reviewed.   Results for orders placed or performed during the hospital encounter of 02/14/12  Surgical pcr screen  Result Value Ref Range   MRSA, PCR NEGATIVE NEGATIVE   Staphylococcus aureus NEGATIVE NEGATIVE      Assessment & Plan:   Problem List Items Addressed This Visit      Musculoskeletal and Integument   Degenerative disc disease, lumbar   Relevant Medications   buprenorphine (SUBUTEX) 8 MG SUBL SL tablet   tiZANidine (ZANAFLEX) 4 MG tablet   Other Relevant Orders   Ambulatory referral to Orthopedic Surgery     Other   Depressive disorder - Primary   Relevant Medications   topiramate (TOPAMAX) 25 MG tablet    Other Visit Diagnoses    Encounter for smoking cessation counseling        Relevant Medications    varenicline (CHANTIX STARTING MONTH PAK) 0.5 MG X 11 & 1 MG X 42 tablet        Follow up plan: Return in about 2 months (around 12/09/2015), or if symptoms worsen or fail to improve, for Recheck anxiety and depression.  Counseling provided for all of the vaccine components Orders Placed This Encounter  Procedures  . Ambulatory referral to  Northway Dulse Rutan, MD [facility] 10/08/2015, 12:22 PM

## 2015-10-22 ENCOUNTER — Telehealth: Payer: Self-pay | Admitting: Family Medicine

## 2015-10-22 MED ORDER — QUETIAPINE FUMARATE ER 150 MG PO TB24: 150.0000 mg | ORAL_TABLET | Freq: Every day | ORAL | 2 refills | Status: DC

## 2015-10-22 NOTE — Telephone Encounter (Signed)
Pt aware sent to pharmacy. [facility] has informed her that they had to send over a prior authorization for this medication.

## 2015-10-27 ENCOUNTER — Other Ambulatory Visit: Payer: Self-pay | Admitting: Family Medicine

## 2015-10-27 DIAGNOSIS — M5136 Other intervertebral disc degeneration, lumbar region: Secondary | ICD-10-CM

## 2015-11-18 ENCOUNTER — Telehealth: Payer: Self-pay | Admitting: Family Medicine

## 2015-11-19 ENCOUNTER — Other Ambulatory Visit: Payer: Self-pay | Admitting: Family Medicine

## 2015-11-19 DIAGNOSIS — M5136 Other intervertebral disc degeneration, lumbar region: Secondary | ICD-10-CM

## 2015-11-25 ENCOUNTER — Encounter (HOSPITAL_COMMUNITY): Payer: Self-pay | Admitting: Psychiatry

## 2015-11-25 ENCOUNTER — Ambulatory Visit (INDEPENDENT_AMBULATORY_CARE_PROVIDER_SITE_OTHER): Payer: Medicaid Other | Admitting: Psychiatry

## 2015-11-25 VITALS — BP 127/86 | HR 76 | Ht 70.0 in | Wt 171.4 lb

## 2015-11-25 DIAGNOSIS — F4310 Post-traumatic stress disorder, unspecified: Secondary | ICD-10-CM | POA: Diagnosis not present

## 2015-11-25 MED ORDER — QUETIAPINE FUMARATE ER 150 MG PO TB24: 150.0000 mg | ORAL_TABLET | Freq: Every day | ORAL | 2 refills | Status: DC

## 2015-11-25 MED ORDER — DULOXETINE HCL 60 MG PO CPEP: 60.0000 mg | ORAL_CAPSULE | Freq: Two times a day (BID) | ORAL | 2 refills | Status: DC

## 2015-11-25 MED ORDER — PRAZOSIN HCL 5 MG PO CAPS: 5.0000 mg | ORAL_CAPSULE | Freq: Every day | ORAL | 2 refills | Status: DC
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
## 2015-12-10 ENCOUNTER — Encounter: Payer: Self-pay | Admitting: Family Medicine

## 2015-12-10 ENCOUNTER — Ambulatory Visit (INDEPENDENT_AMBULATORY_CARE_PROVIDER_SITE_OTHER): Payer: Medicaid Other | Admitting: Family Medicine

## 2015-12-10 VITALS — BP 119/87 | HR 99 | Temp 98.8°F | Ht 70.0 in | Wt 172.1 lb

## 2015-12-10 DIAGNOSIS — R55 Syncope and collapse: Secondary | ICD-10-CM | POA: Diagnosis not present

## 2015-12-10 DIAGNOSIS — M5136 Other intervertebral disc degeneration, lumbar region: Secondary | ICD-10-CM

## 2015-12-10 DIAGNOSIS — R61 Generalized hyperhidrosis: Secondary | ICD-10-CM | POA: Diagnosis not present

## 2015-12-10 MED ORDER — TIZANIDINE HCL 4 MG PO TABS: 4.0000 mg | ORAL_TABLET | Freq: Three times a day (TID) | ORAL | 0 refills | Status: DC | PRN

## 2015-12-10 MED ORDER — QUETIAPINE FUMARATE ER 300 MG PO TB24: 300.0000 mg | ORAL_TABLET | Freq: Every day | ORAL | 1 refills | Status: DC

## 2015-12-10 NOTE — Progress Notes (Signed)
BP 119/87   Pulse 99   Temp 98.8 F (37.1 C) (Oral)   Ht 5\' 10"  (1.778 m)   Wt 172 lb 2 oz (78.1 kg)   BMI 24.70 kg/m    Subjective:    Patient ID: Brittany Rios, female    DOB: 05-Nov-1975, 40 y.o.   MRN: [id number]  HPI: Brittany Rios is a 40 y.o. female presenting on 12/10/2015 for Recent episode of abdominal pain, excessive sweating and los (went to ER at [facility] by EMS, refused to stay inpatient as they recommended.  she had started Minipress that same day, symptoms started about 45 minutes after she took medication)   HPI Hospital follow-up for syncope and flushing Patient is coming in today for a hospital follow-up for an episode of flushing and syncope. A little over a week ago patient was in the hospital for this and was seen in the emergency department and they had wanted to admit her to monitor for heart but she opted not to stay. She says that all occurred when she woke up in the morning and she took her prazosin and then about 45 minutes later she had this feeling of sweats and flushing and lightheadedness and abdominal cramping. After this happened she tried to get up to answer the door and she had a complete syncopal episode. By this time there was somebody there with her and they did not witness any seizure-like activity. She said that she did not feel any lightheadedness, before it occurred that she just felt flushed. She was taken to the emergency department at [facility] where she had labs and an EKG done which came back normal but they recommended that she stay because they wanted to monitor her heart overnight and she refused admission. He thinks it was just due to the prazosin that she took for the first time on that day. Since that day over the past week and a half she has not had any further episodes of this and she has not taken the prazosin any further. She has never had similar episodes previous to taking the prazosin. She has had palpitations once or twice in the  past in her life but nothing recently. She denies any chest pain or shortness of breath or wheezing.  Degenerative disc disease and back pain Patient is coming in today for recheck on her degenerative disc disease. It is continuing to hurt her. She is using the Zanaflex and she likes the way that it works for her. She is also using the Cymbalta does not know if it's helping her back is much as her mood. She does not know what happened to the referral for the orthopedic and we will check on that status or put in for again. She denies any numbness or weakness in either lower extremity.  Relevant past medical, surgical, family and social history reviewed and updated as indicated. Interim medical history since our last visit reviewed. Allergies and medications reviewed and updated.  Review of Systems  Constitutional: Positive for diaphoresis. Negative for chills and fever.  HENT: Negative for congestion, ear discharge and ear pain.   Eyes: Negative for redness and visual disturbance.  Respiratory: Negative for cough, chest tightness, shortness of breath and wheezing.   Cardiovascular: Positive for palpitations. Negative for chest pain and leg swelling.  Genitourinary: Negative for difficulty urinating and dysuria.  Musculoskeletal: Positive for back pain. Negative for gait problem.  Skin: Negative for rash.  Neurological: Positive for syncope and light-headedness. Negative for  tremors, seizures, speech difficulty, weakness, numbness and headaches.  Psychiatric/Behavioral: Negative for agitation and behavioral problems.  All other systems reviewed and are negative.   Per HPI unless specifically indicated above     Medication List       Accurate as of 12/10/15  5:33 PM. Always use your most recent med list.          albuterol 108 (90 Base) MCG/ACT inhaler Commonly known as:  PROVENTIL HFA;VENTOLIN HFA Inhale 2 puffs into the lungs every 6 (six) hours as needed for wheezing or shortness  of breath.   buprenorphine 8 MG Subl SL tablet Commonly known as:  SUBUTEX 8 mg 2 (two) times daily.   DULoxetine 60 MG capsule Commonly known as:  CYMBALTA Take 1 capsule (60 mg total) by mouth 2 (two) times daily.   QUEtiapine 300 MG 24 hr tablet Commonly known as:  SEROQUEL XR Take 1 tablet (300 mg total) by mouth at bedtime.   tiZANidine 4 MG tablet Commonly known as:  ZANAFLEX Take 1 tablet (4 mg total) by mouth every 8 (eight) hours as needed for muscle spasms.   topiramate 25 MG tablet Commonly known as:  TOPAMAX Take 1 tablet (25 mg total) by mouth 2 (two) times daily.          Objective:    BP 119/87   Pulse 99   Temp 98.8 F (37.1 C) (Oral)   Ht 5\' 10"  (1.778 m)   Wt 172 lb 2 oz (78.1 kg)   BMI 24.70 kg/m   Wt Readings from Last 3 Encounters:  12/10/15 172 lb 2 oz (78.1 kg)  11/25/15 171 lb 6.4 oz (77.7 kg)  10/08/15 171 lb (77.6 kg)    Physical Exam  Constitutional: She is oriented to person, place, and time. She appears well-developed and well-nourished. No distress.  Eyes: Conjunctivae are normal.  Cardiovascular: Normal rate, regular rhythm, normal heart sounds and intact distal pulses.   No murmur heard. Pulmonary/Chest: Effort normal and breath sounds normal. No respiratory distress. She has no wheezes.  Musculoskeletal: Normal range of motion. She exhibits tenderness (low back pain, bilateral, lumbar, neg SLR b/l). She exhibits no edema.  Neurological: She is alert and oriented to person, place, and time. Coordination normal.  Skin: Skin is warm and dry. No rash noted. She is not diaphoretic.  Psychiatric: She has a normal mood and affect. Her behavior is normal.  Nursing note and vitals reviewed.   EKG:NSR, no changes from EKG performed at [facility] hospital    Assessment & Plan:   Problem List Items Addressed This Visit      Musculoskeletal and Integument   Degenerative disc disease, lumbar   Relevant Medications   tiZANidine  (ZANAFLEX) 4 MG tablet   Other Relevant Orders   Ambulatory referral to Orthopedic Surgery    Other Visit Diagnoses    Excessive sweating    -  Primary   Patient had an episode of flushing, about 45 minutes after starting prazosin. She was taken to the [facility] emergency department and everything came back normal   Relevant Orders   EKG 12-Lead   Syncope, unspecified syncope type       Continue to monitor if recurs will do a 48-hour Holter monitor but is not then is likely due to the prazosin       Follow up plan: Return in about 3 months (around 03/10/2016), or if symptoms worsen or fail to improve, for Recheck anxiety and depression.  Counseling  provided for all of the vaccine components Orders Placed This Encounter  Procedures  . Ambulatory referral to Orthopedic Surgery  . EKG 12-Lead    Caryl Pina, MD [facility] 12/10/2015, 5:33 PM

## 2015-12-26 ENCOUNTER — Ambulatory Visit (HOSPITAL_COMMUNITY): Payer: Self-pay | Admitting: Psychiatry

## 2016-01-08 ENCOUNTER — Other Ambulatory Visit: Payer: Self-pay

## 2016-01-08 DIAGNOSIS — M5136 Other intervertebral disc degeneration, lumbar region: Secondary | ICD-10-CM

## 2016-01-08 MED ORDER — TIZANIDINE HCL 4 MG PO TABS: 4.0000 mg | ORAL_TABLET | Freq: Three times a day (TID) | ORAL | 0 refills | Status: DC | PRN

## 2016-02-06 ENCOUNTER — Other Ambulatory Visit: Payer: Self-pay | Admitting: Family Medicine

## 2016-02-06 DIAGNOSIS — M5136 Other intervertebral disc degeneration, lumbar region: Secondary | ICD-10-CM

## 2016-02-10 ENCOUNTER — Ambulatory Visit: Payer: Medicaid Other | Admitting: Family Medicine

## 2016-02-11 ENCOUNTER — Encounter: Payer: Self-pay | Admitting: Family Medicine

## 2016-02-11 ENCOUNTER — Telehealth: Payer: Self-pay | Admitting: Family Medicine

## 2016-02-11 NOTE — Telephone Encounter (Signed)
error 

## 2016-02-12 ENCOUNTER — Ambulatory Visit: Payer: Medicaid Other | Admitting: Family Medicine

## 2016-02-14 ENCOUNTER — Other Ambulatory Visit: Payer: Self-pay | Admitting: Family Medicine

## 2016-02-17 ENCOUNTER — Encounter: Payer: Self-pay | Admitting: Family Medicine

## 2016-02-18 ENCOUNTER — Telehealth: Payer: Self-pay | Admitting: Family Medicine

## 2016-02-18 ENCOUNTER — Ambulatory Visit: Payer: Medicaid Other | Admitting: Family Medicine

## 2016-02-18 NOTE — Telephone Encounter (Signed)
Pt was dismissed from practice for no shows.  Called to reschedule appt and explained this to her.  She is asking for one more chance.  She has been dealing with some things and will do better.  I explained to her the provider would have to okay this and I would ask.

## 2016-02-18 NOTE — Telephone Encounter (Signed)
This is a clinic policy and not provide her specific, our clinic policy is 3 no shows and she's had 4 now, explained to her that we're sorry about this

## 2016-02-20 NOTE — Telephone Encounter (Signed)
Patient aware of clinic policy

## 2016-02-27 ENCOUNTER — Other Ambulatory Visit: Payer: Self-pay | Admitting: Family Medicine

## 2016-02-27 DIAGNOSIS — M5136 Other intervertebral disc degeneration, lumbar region: Secondary | ICD-10-CM

## 2016-03-10 ENCOUNTER — Ambulatory Visit: Payer: Medicaid Other | Admitting: Family Medicine

## 2016-03-18 ENCOUNTER — Other Ambulatory Visit: Payer: Self-pay | Admitting: Family Medicine

## 2016-03-18 DIAGNOSIS — M5136 Other intervertebral disc degeneration, lumbar region: Secondary | ICD-10-CM

## 2016-03-26 ENCOUNTER — Other Ambulatory Visit: Payer: Self-pay | Admitting: Family Medicine

## 2016-03-26 DIAGNOSIS — M5136 Other intervertebral disc degeneration, lumbar region: Secondary | ICD-10-CM

## 2016-04-21 ENCOUNTER — Other Ambulatory Visit: Payer: Self-pay | Admitting: Family Medicine

## 2016-04-21 DIAGNOSIS — M5136 Other intervertebral disc degeneration, lumbar region: Secondary | ICD-10-CM

## 2016-05-05 ENCOUNTER — Other Ambulatory Visit: Payer: Self-pay | Admitting: Family Medicine

## 2016-05-05 DIAGNOSIS — M5136 Other intervertebral disc degeneration, lumbar region: Secondary | ICD-10-CM

## 2017-07-28 ENCOUNTER — Ambulatory Visit (INDEPENDENT_AMBULATORY_CARE_PROVIDER_SITE_OTHER): Payer: Self-pay | Admitting: Internal Medicine

## 2017-07-29 ENCOUNTER — Encounter (INDEPENDENT_AMBULATORY_CARE_PROVIDER_SITE_OTHER): Payer: Self-pay | Admitting: Internal Medicine

## 2017-07-29 ENCOUNTER — Ambulatory Visit (INDEPENDENT_AMBULATORY_CARE_PROVIDER_SITE_OTHER): Payer: Self-pay | Admitting: Internal Medicine

## 2017-07-29 VITALS — BP 90/70 | HR 60 | Temp 97.4°F | Ht 71.0 in | Wt 161.4 lb

## 2017-07-29 DIAGNOSIS — R1013 Epigastric pain: Secondary | ICD-10-CM

## 2017-07-29 LAB — CBC WITH DIFFERENTIAL/PLATELET
BASOS ABS: 58 {cells}/uL (ref 0–200)
Basophils Relative: 0.5 %
Eosinophils Absolute: 128 cells/uL (ref 15–500)
Eosinophils Relative: 1.1 %
HEMATOCRIT: 42.5 % (ref 35.0–45.0)
HEMOGLOBIN: 14.7 g/dL (ref 11.7–15.5)
LYMPHS ABS: 2958 {cells}/uL (ref 850–3900)
MCH: 30.1 pg (ref 27.0–33.0)
MCHC: 34.6 g/dL (ref 32.0–36.0)
MCV: 87.1 fL (ref 80.0–100.0)
MPV: 11 fL (ref 7.5–12.5)
Monocytes Relative: 6 %
NEUTROS ABS: 7760 {cells}/uL (ref 1500–7800)
NEUTROS PCT: 66.9 %
Platelets: 280 10*3/uL (ref 140–400)
RBC: 4.88 10*6/uL (ref 3.80–5.10)
RDW: 12.3 % (ref 11.0–15.0)
Total Lymphocyte: 25.5 %
WBC: 11.6 10*3/uL — ABNORMAL HIGH (ref 3.8–10.8)
WBCMIX: 696 {cells}/uL (ref 200–950)

## 2017-07-29 LAB — URINALYSIS
BILIRUBIN URINE: NEGATIVE
Glucose, UA: NEGATIVE
HGB URINE DIPSTICK: NEGATIVE
Leukocytes, UA: NEGATIVE
Nitrite: NEGATIVE
PROTEIN: NEGATIVE
Specific Gravity, Urine: 1.014 (ref 1.001–1.03)
pH: 6.5 (ref 5.0–8.0)

## 2017-07-29 LAB — COMPREHENSIVE METABOLIC PANEL
AG RATIO: 1.8 (calc) (ref 1.0–2.5)
ALBUMIN MSPROF: 4.2 g/dL (ref 3.6–5.1)
ALKALINE PHOSPHATASE (APISO): 53 U/L (ref 33–115)
ALT: 11 U/L (ref 6–29)
AST: 15 U/L (ref 10–30)
BILIRUBIN TOTAL: 0.7 mg/dL (ref 0.2–1.2)
BUN: 8 mg/dL (ref 7–25)
CALCIUM: 9.6 mg/dL (ref 8.6–10.2)
CHLORIDE: 100 mmol/L (ref 98–110)
CO2: 30 mmol/L (ref 20–32)
Creat: 0.7 mg/dL (ref 0.50–1.10)
Globulin: 2.3 g/dL (calc) (ref 1.9–3.7)
Glucose, Bld: 94 mg/dL (ref 65–139)
POTASSIUM: 3.6 mmol/L (ref 3.5–5.3)
Sodium: 139 mmol/L (ref 135–146)
TOTAL PROTEIN: 6.5 g/dL (ref 6.1–8.1)

## 2017-07-29 NOTE — Patient Instructions (Signed)
Labs today
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
## 2017-07-31 ENCOUNTER — Other Ambulatory Visit: Payer: Self-pay

## 2017-07-31 ENCOUNTER — Encounter (HOSPITAL_COMMUNITY): Payer: Self-pay | Admitting: Emergency Medicine

## 2017-07-31 ENCOUNTER — Emergency Department (HOSPITAL_COMMUNITY): Payer: Self-pay

## 2017-07-31 ENCOUNTER — Emergency Department (HOSPITAL_COMMUNITY)
Admission: EM | Admit: 2017-07-31 | Discharge: 2017-07-31 | Disposition: A | Discharge status: Home / Self Care | Payer: Self-pay | Attending: Emergency Medicine | Admitting: Emergency Medicine

## 2017-07-31 DIAGNOSIS — F17210 Nicotine dependence, cigarettes, uncomplicated: Secondary | ICD-10-CM | POA: Insufficient documentation

## 2017-07-31 DIAGNOSIS — R1013 Epigastric pain: Secondary | ICD-10-CM | POA: Insufficient documentation

## 2017-07-31 DIAGNOSIS — R112 Nausea with vomiting, unspecified: Secondary | ICD-10-CM | POA: Insufficient documentation

## 2017-07-31 DIAGNOSIS — Z79899 Other long term (current) drug therapy: Secondary | ICD-10-CM | POA: Insufficient documentation

## 2017-07-31 DIAGNOSIS — I10 Essential (primary) hypertension: Secondary | ICD-10-CM | POA: Insufficient documentation

## 2017-07-31 HISTORY — DX: Unspecified ovarian cyst, unspecified side: N83.209

## 2017-07-31 HISTORY — DX: Noninfective gastroenteritis and colitis, unspecified: K52.9

## 2017-07-31 HISTORY — DX: Other specified bacterial intestinal infections: A04.8

## 2017-07-31 HISTORY — DX: Gastritis, unspecified, without bleeding: K29.70

## 2017-07-31 LAB — URINALYSIS, ROUTINE W REFLEX MICROSCOPIC
Bilirubin Urine: NEGATIVE
GLUCOSE, UA: NEGATIVE mg/dL
Hgb urine dipstick: NEGATIVE
Ketones, ur: 20 mg/dL — AB
Leukocytes, UA: NEGATIVE
NITRITE: NEGATIVE
PH: 5 (ref 5.0–8.0)
Protein, ur: 30 mg/dL — AB
SPECIFIC GRAVITY, URINE: 1.023 (ref 1.005–1.030)

## 2017-07-31 LAB — COMPREHENSIVE METABOLIC PANEL
ALK PHOS: 50 U/L (ref 38–126)
ALT: 17 U/L (ref 14–54)
AST: 19 U/L (ref 15–41)
Albumin: 4.2 g/dL (ref 3.5–5.0)
Anion gap: 10 (ref 5–15)
BILIRUBIN TOTAL: 0.6 mg/dL (ref 0.3–1.2)
BUN: 9 mg/dL (ref 6–20)
CHLORIDE: 100 mmol/L — AB (ref 101–111)
CO2: 27 mmol/L (ref 22–32)
CREATININE: 0.61 mg/dL (ref 0.44–1.00)
Calcium: 9.2 mg/dL (ref 8.9–10.3)
GFR calc Af Amer: >60 mL/min (ref >60)
Glucose, Bld: 108 mg/dL — ABNORMAL HIGH (ref 65–99)
Potassium: 3.3 mmol/L — ABNORMAL LOW (ref 3.5–5.1)
Sodium: 137 mmol/L (ref 135–145)
TOTAL PROTEIN: 7 g/dL (ref 6.5–8.1)

## 2017-07-31 LAB — LIPASE, BLOOD: Lipase: 30 U/L (ref 11–51)

## 2017-07-31 LAB — CBC WITH DIFFERENTIAL/PLATELET
Basophils Absolute: 0.1 10*3/uL (ref 0.0–0.1)
Basophils Relative: 0 %
Eosinophils Absolute: 0.2 10*3/uL (ref 0.0–0.7)
Eosinophils Relative: 2 %
HEMATOCRIT: 47.1 % — AB (ref 36.0–46.0)
HEMOGLOBIN: 15.9 g/dL — AB (ref 12.0–15.0)
LYMPHS ABS: 1.9 10*3/uL (ref 0.7–4.0)
LYMPHS PCT: 14 %
MCH: 30.3 pg (ref 26.0–34.0)
MCHC: 33.8 g/dL (ref 30.0–36.0)
MCV: 89.7 fL (ref 78.0–100.0)
MONOS PCT: 6 %
Monocytes Absolute: 0.8 10*3/uL (ref 0.1–1.0)
NEUTROS ABS: 10.8 10*3/uL — AB (ref 1.7–7.7)
Neutrophils Relative %: 78 %
Platelets: 268 10*3/uL (ref 150–400)
RBC: 5.25 MIL/uL — ABNORMAL HIGH (ref 3.87–5.11)
RDW: 13 % (ref 11.5–15.5)
WBC: 13.8 10*3/uL — AB (ref 4.0–10.5)

## 2017-07-31 LAB — PREGNANCY, URINE: Preg Test, Ur: NEGATIVE

## 2017-07-31 MED ORDER — ONDANSETRON 4 MG PO TBDP: ORAL_TABLET | ORAL | 0 refills | Status: DC

## 2017-07-31 MED ORDER — ONDANSETRON HCL 4 MG/2ML IJ SOLN
4.0000 mg | Freq: Once | INTRAMUSCULAR | Status: AC
  Administered 2017-07-31: 4 mg via INTRAVENOUS
  Filled 2017-07-31: qty 2

## 2017-07-31 MED ORDER — HYDROMORPHONE HCL 4 MG PO TABS: 4.0000 mg | ORAL_TABLET | Freq: Four times a day (QID) | ORAL | 0 refills | Status: AC | PRN

## 2017-07-31 MED ORDER — FAMOTIDINE IN NACL 20-0.9 MG/50ML-% IV SOLN
20.0000 mg | Freq: Once | INTRAVENOUS | Status: AC
  Administered 2017-07-31: 20 mg via INTRAVENOUS
  Filled 2017-07-31: qty 50

## 2017-07-31 MED ORDER — IOPAMIDOL (ISOVUE-300) INJECTION 61%
100.0000 mL | Freq: Once | INTRAVENOUS | Status: AC | PRN
  Administered 2017-07-31: 100 mL via INTRAVENOUS

## 2017-07-31 MED ORDER — SODIUM CHLORIDE 0.9 % IV BOLUS
1000.0000 mL | Freq: Once | INTRAVENOUS | Status: AC
  Administered 2017-07-31: 1000 mL via INTRAVENOUS

## 2017-07-31 MED ORDER — HYDROMORPHONE HCL 1 MG/ML IJ SOLN
1.0000 mg | Freq: Once | INTRAMUSCULAR | Status: AC
  Administered 2017-07-31: 1 mg via INTRAVENOUS
  Filled 2017-07-31: qty 1

## 2017-07-31 MED ORDER — IOPAMIDOL (ISOVUE-300) INJECTION 61%
30.0000 mL | Freq: Once | INTRAVENOUS | Status: AC | PRN
  Administered 2017-07-31: 30 mL via ORAL

## 2017-07-31 NOTE — Discharge Instructions (Addendum)
Call your gastroenterologist Monday and let them know you are in the emergency department with the pain.  Follow-up with the OB/GYN doctor [place] and to 3 weeks about your ovarian cyst

## 2017-07-31 NOTE — ED Triage Notes (Signed)
Patient c/o generalized abd pain that radiates into back. Patient reports nausea, vomiting, and fevers. Denies any diarrhea or urinary symptoms. Per patient no BM in 2 weeks. Patient has had pain x2 weeks. Patient seen mulitple times at [facility] and followed up with GI specialist. Patient had CT and ultrasound on Wednesday at [facility]. Per GI gastritis and H. Pylori- possibly colitis, UTI, and kidney infection. Per family member pain starts every morning between 3am-5am and is progressively getting worse despite taking dicyclomine 10mg , doxcycline 100mg , metronidazole 500mg , compro 25mg  suppository, and dilaudid 2mg .

## 2017-07-31 NOTE — ED Notes (Signed)
Patient transported to CT 
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
## 2017-08-01 ENCOUNTER — Telehealth (INDEPENDENT_AMBULATORY_CARE_PROVIDER_SITE_OTHER): Payer: Self-pay | Admitting: Internal Medicine

## 2017-08-01 ENCOUNTER — Other Ambulatory Visit: Payer: Self-pay

## 2017-08-01 ENCOUNTER — Encounter (HOSPITAL_COMMUNITY): Payer: Self-pay | Admitting: Emergency Medicine

## 2017-08-01 ENCOUNTER — Emergency Department (HOSPITAL_COMMUNITY)
Admission: EM | Admit: 2017-08-01 | Discharge: 2017-08-01 | Disposition: A | Discharge status: Home / Self Care | Payer: Medicaid Other | Attending: Emergency Medicine | Admitting: Emergency Medicine

## 2017-08-01 DIAGNOSIS — F17210 Nicotine dependence, cigarettes, uncomplicated: Secondary | ICD-10-CM | POA: Insufficient documentation

## 2017-08-01 DIAGNOSIS — R112 Nausea with vomiting, unspecified: Secondary | ICD-10-CM | POA: Insufficient documentation

## 2017-08-01 DIAGNOSIS — Z79899 Other long term (current) drug therapy: Secondary | ICD-10-CM | POA: Insufficient documentation

## 2017-08-01 DIAGNOSIS — I10 Essential (primary) hypertension: Secondary | ICD-10-CM | POA: Insufficient documentation

## 2017-08-01 DIAGNOSIS — R1010 Upper abdominal pain, unspecified: Secondary | ICD-10-CM | POA: Insufficient documentation

## 2017-08-01 DIAGNOSIS — K625 Hemorrhage of anus and rectum: Secondary | ICD-10-CM | POA: Insufficient documentation

## 2017-08-01 LAB — TYPE AND SCREEN
ABO/RH(D): O NEG
Antibody Screen: NEGATIVE

## 2017-08-01 LAB — COMPREHENSIVE METABOLIC PANEL
ALK PHOS: 52 U/L (ref 38–126)
ALT: 16 U/L (ref 14–54)
AST: 19 U/L (ref 15–41)
Albumin: 4.4 g/dL (ref 3.5–5.0)
Anion gap: 10 (ref 5–15)
BUN: 5 mg/dL — AB (ref 6–20)
CALCIUM: 9.5 mg/dL (ref 8.9–10.3)
CHLORIDE: 98 mmol/L — AB (ref 101–111)
CO2: 29 mmol/L (ref 22–32)
CREATININE: 0.67 mg/dL (ref 0.44–1.00)
GFR calc Af Amer: >60 mL/min (ref >60)
GFR calc non Af Amer: >60 mL/min (ref >60)
Glucose, Bld: 135 mg/dL — ABNORMAL HIGH (ref 65–99)
Potassium: 3 mmol/L — ABNORMAL LOW (ref 3.5–5.1)
Sodium: 137 mmol/L (ref 135–145)
Total Bilirubin: 0.7 mg/dL (ref 0.3–1.2)
Total Protein: 7.3 g/dL (ref 6.5–8.1)

## 2017-08-01 LAB — CBC
HCT: 43.7 % (ref 36.0–46.0)
Hemoglobin: 14.7 g/dL (ref 12.0–15.0)
MCH: 30.6 pg (ref 26.0–34.0)
MCHC: 33.6 g/dL (ref 30.0–36.0)
MCV: 90.9 fL (ref 78.0–100.0)
PLATELETS: 260 10*3/uL (ref 150–400)
RBC: 4.81 MIL/uL (ref 3.87–5.11)
RDW: 13.1 % (ref 11.5–15.5)
WBC: 9.5 10*3/uL (ref 4.0–10.5)

## 2017-08-01 LAB — LIPASE, BLOOD: Lipase: 41 U/L (ref 11–51)

## 2017-08-01 LAB — URINALYSIS, ROUTINE W REFLEX MICROSCOPIC
Bilirubin Urine: NEGATIVE
GLUCOSE, UA: NEGATIVE mg/dL
Hgb urine dipstick: NEGATIVE
Ketones, ur: NEGATIVE mg/dL
LEUKOCYTES UA: NEGATIVE
NITRITE: NEGATIVE
PH: 7 (ref 5.0–8.0)
Protein, ur: NEGATIVE mg/dL
SPECIFIC GRAVITY, URINE: 1.005 (ref 1.005–1.030)

## 2017-08-01 LAB — POC OCCULT BLOOD, ED: Fecal Occult Bld: NEGATIVE

## 2017-08-01 MED ORDER — POTASSIUM CHLORIDE CRYS ER 20 MEQ PO TBCR
20.0000 meq | EXTENDED_RELEASE_TABLET | Freq: Once | ORAL | Status: AC
  Administered 2017-08-01: 20 meq via ORAL
  Filled 2017-08-01: qty 1

## 2017-08-01 MED ORDER — SUCRALFATE 1 GM/10ML PO SUSP
1.0000 g | Freq: Once | ORAL | Status: AC
  Administered 2017-08-01: 1 g via ORAL
  Filled 2017-08-01: qty 10

## 2017-08-01 MED ORDER — SUCRALFATE 1 GM/10ML PO SUSP: 1.0000 g | Freq: Three times a day (TID) | ORAL | 0 refills | Status: DC

## 2017-08-01 NOTE — ED Triage Notes (Signed)
Pt c/o of abdominal pain with n/v since last Sunday.  Pt also states having black stool this morning.

## 2017-08-01 NOTE — Telephone Encounter (Signed)
Message left for her to return my call.

## 2017-08-01 NOTE — Discharge Instructions (Addendum)
Your evaluated in the emergency department for continued upper abdominal pain now with dark black diarrhea.  Your lab tests did not show any significant findings other than a slightly low potassium and you were negative for blood on rectal exam.  We are prescribing you Carafate to help coat your stomach.  You should continue your other medications as directed by your doctor and follow-up with a GI doctor.  You also need to follow-up with gynecology regarding your dermoid cyst.
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
## 2017-08-01 NOTE — Telephone Encounter (Signed)
Patients sgo called stated patient had to go back to emergency dept this past weekend - please call her at [phone]

## 2017-08-02 ENCOUNTER — Telehealth (INDEPENDENT_AMBULATORY_CARE_PROVIDER_SITE_OTHER): Payer: Self-pay | Admitting: Internal Medicine

## 2017-08-02 NOTE — Telephone Encounter (Signed)
I spoke with patient. She feels better. There was no blood in her stool. Am going to see her back in 4 weeks. She will probably need an EGD. Has appt with GYN next week

## 2017-08-02 NOTE — Telephone Encounter (Signed)
I have spoken with patient. See telephone encounter

## 2017-08-02 NOTE — Telephone Encounter (Signed)
OV in 4 weekws

## 2017-08-02 NOTE — Telephone Encounter (Signed)
OV in 4 weeks.  

## 2017-08-02 NOTE — Telephone Encounter (Signed)
Patients boyfriend call stated they were returning your call ph# [phone]

## 2017-08-09 ENCOUNTER — Encounter (HOSPITAL_COMMUNITY): Payer: Self-pay | Admitting: Emergency Medicine

## 2017-08-09 ENCOUNTER — Other Ambulatory Visit (HOSPITAL_COMMUNITY): Payer: Self-pay

## 2017-08-09 ENCOUNTER — Other Ambulatory Visit: Payer: Self-pay

## 2017-08-09 ENCOUNTER — Emergency Department (HOSPITAL_COMMUNITY): Payer: Self-pay

## 2017-08-09 ENCOUNTER — Encounter (HOSPITAL_COMMUNITY): Payer: Self-pay

## 2017-08-09 ENCOUNTER — Other Ambulatory Visit (INDEPENDENT_AMBULATORY_CARE_PROVIDER_SITE_OTHER): Payer: Self-pay | Admitting: *Deleted

## 2017-08-09 ENCOUNTER — Emergency Department (HOSPITAL_COMMUNITY)
Admission: EM | Admit: 2017-08-09 | Discharge: 2017-08-09 | Disposition: A | Discharge status: Home / Self Care | Payer: Self-pay | Attending: Emergency Medicine | Admitting: Emergency Medicine

## 2017-08-09 ENCOUNTER — Encounter (HOSPITAL_COMMUNITY)
Admission: RE | Admit: 2017-08-09 | Discharge: 2017-08-09 | Disposition: A | Discharge status: Home / Self Care | Payer: Medicaid Other | Source: Ambulatory Visit | Attending: Internal Medicine | Admitting: Internal Medicine

## 2017-08-09 DIAGNOSIS — I10 Essential (primary) hypertension: Secondary | ICD-10-CM | POA: Insufficient documentation

## 2017-08-09 DIAGNOSIS — R1013 Epigastric pain: Secondary | ICD-10-CM | POA: Insufficient documentation

## 2017-08-09 DIAGNOSIS — R112 Nausea with vomiting, unspecified: Secondary | ICD-10-CM | POA: Insufficient documentation

## 2017-08-09 DIAGNOSIS — F17210 Nicotine dependence, cigarettes, uncomplicated: Secondary | ICD-10-CM | POA: Insufficient documentation

## 2017-08-09 DIAGNOSIS — R1010 Upper abdominal pain, unspecified: Secondary | ICD-10-CM | POA: Insufficient documentation

## 2017-08-09 LAB — URINALYSIS, ROUTINE W REFLEX MICROSCOPIC
BILIRUBIN URINE: NEGATIVE
GLUCOSE, UA: NEGATIVE mg/dL
Hgb urine dipstick: NEGATIVE
Ketones, ur: NEGATIVE mg/dL
Leukocytes, UA: NEGATIVE
Nitrite: NEGATIVE
PH: 8 (ref 5.0–8.0)
Protein, ur: NEGATIVE mg/dL
SPECIFIC GRAVITY, URINE: 1.018 (ref 1.005–1.030)

## 2017-08-09 LAB — COMPREHENSIVE METABOLIC PANEL
ALBUMIN: 4.5 g/dL (ref 3.5–5.0)
ALT: 14 U/L (ref 14–54)
ANION GAP: 9 (ref 5–15)
AST: 15 U/L (ref 15–41)
Alkaline Phosphatase: 57 U/L (ref 38–126)
BUN: 7 mg/dL (ref 6–20)
CHLORIDE: 101 mmol/L (ref 101–111)
CO2: 29 mmol/L (ref 22–32)
Calcium: 9.8 mg/dL (ref 8.9–10.3)
Creatinine, Ser: 0.6 mg/dL (ref 0.44–1.00)
GFR calc Af Amer: >60 mL/min (ref >60)
GFR calc non Af Amer: >60 mL/min (ref >60)
Glucose, Bld: 125 mg/dL — ABNORMAL HIGH (ref 65–99)
POTASSIUM: 3.4 mmol/L — AB (ref 3.5–5.1)
SODIUM: 139 mmol/L (ref 135–145)
Total Bilirubin: 0.8 mg/dL (ref 0.3–1.2)
Total Protein: 7.9 g/dL (ref 6.5–8.1)

## 2017-08-09 LAB — RAPID URINE DRUG SCREEN, HOSP PERFORMED
AMPHETAMINES: NOT DETECTED
BENZODIAZEPINES: POSITIVE — AB
Barbiturates: NOT DETECTED
Cocaine: NOT DETECTED
Opiates: POSITIVE — AB
TETRAHYDROCANNABINOL: NOT DETECTED

## 2017-08-09 LAB — CBC
HEMATOCRIT: 46.2 % — AB (ref 36.0–46.0)
HEMOGLOBIN: 15.5 g/dL — AB (ref 12.0–15.0)
MCH: 30.6 pg (ref 26.0–34.0)
MCHC: 33.5 g/dL (ref 30.0–36.0)
MCV: 91.1 fL (ref 78.0–100.0)
Platelets: 230 10*3/uL (ref 150–400)
RBC: 5.07 MIL/uL (ref 3.87–5.11)
RDW: 13.2 % (ref 11.5–15.5)
WBC: 9.4 10*3/uL (ref 4.0–10.5)

## 2017-08-09 LAB — I-STAT BETA HCG BLOOD, ED (MC, WL, AP ONLY): I-stat hCG, quantitative: <5 m[IU]/mL (ref <5)

## 2017-08-09 LAB — LIPASE, BLOOD: Lipase: 38 U/L (ref 11–51)

## 2017-08-09 MED ORDER — ONDANSETRON 8 MG PO TBDP: 8.0000 mg | ORAL_TABLET | Freq: Three times a day (TID) | ORAL | 0 refills | Status: AC | PRN

## 2017-08-09 MED ORDER — ONDANSETRON HCL 4 MG/2ML IJ SOLN
4.0000 mg | Freq: Once | INTRAMUSCULAR | Status: AC | PRN
  Administered 2017-08-09: 4 mg via INTRAVENOUS
  Filled 2017-08-09: qty 2

## 2017-08-09 MED ORDER — HYDROMORPHONE HCL 1 MG/ML IJ SOLN
1.0000 mg | Freq: Once | INTRAMUSCULAR | Status: AC
  Administered 2017-08-09: 1 mg via INTRAVENOUS
  Filled 2017-08-09: qty 1

## 2017-08-09 MED ORDER — GI COCKTAIL ~~LOC~~
30.0000 mL | Freq: Once | ORAL | Status: AC
  Administered 2017-08-09: 30 mL via ORAL
  Filled 2017-08-09: qty 30

## 2017-08-09 MED ORDER — ONDANSETRON HCL 4 MG/2ML IJ SOLN
4.0000 mg | Freq: Once | INTRAMUSCULAR | Status: AC
  Administered 2017-08-09: 4 mg via INTRAVENOUS
  Filled 2017-08-09: qty 2

## 2017-08-09 NOTE — Progress Notes (Signed)
Dr Rick Duff notified of K+ 3.0.  Repeat BMET order given

## 2017-08-09 NOTE — Discharge Instructions (Addendum)
Continue your current medications, take the medications as needed for nausea, follow-up with your endoscopy tomorrow as scheduled
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
## 2017-08-09 NOTE — Patient Instructions (Signed)
63    Your procedure is scheduled on: 08/10/2017  Report to [facility] at   7:00  AM.  Call this number if you have problems the morning of surgery: [phone]   Remember:   Do not drink or eat food:After Midnight.     Take these medicines the morning of surgery with A SIP OF WATER: Dilaudid if needed, Zofran or Compazine if needed   Do not wear jewelry, make-up or nail polish.  Do not wear lotions, powders, or perfumes. You may wear deodorant.  Do not shave 48 hours prior to surgery. Men may shave face and neck.  Do not bring valuables to the hospital.  Contacts, dentures or bridgework may not be worn into surgery.  Leave suitcase in the car. After surgery it may be brought to your room.  For patients admitted to the hospital, checkout time is 11:00 AM the day of discharge.   Patients discharged the day of surgery will not be allowed to drive home.  Name and phone number of your driver:    Please read over the following fact sheets that you were given: Pain Booklet, Lab Information and Anesthesia Post-op Instructions   Endoscopy Care After Please read the instructions outlined below and refer to this sheet in the next few weeks. These discharge instructions provide you with general information on caring for yourself after you leave the hospital. Your doctor may also give you specific instructions. While your treatment has been planned according to the most current medical practices available, unavoidable complications occasionally occur. If you have any problems or questions after discharge, please call your doctor. HOME CARE INSTRUCTIONS Activity  You may resume your regular activity but move at a slower pace for the next 24 hours.   Take frequent rest periods for the next 24 hours.   Walking will help expel (get rid of) the air and reduce the bloated feeling in your abdomen.   No driving for 24 hours (because of the anesthesia (medicine) used during the test).   You may  shower.   Do not sign any important legal documents or operate any machinery for 24 hours (because of the anesthesia used during the test).  Nutrition  Drink plenty of fluids.   You may resume your normal diet.   Begin with a light meal and progress to your normal diet.   Avoid alcoholic beverages for 24 hours or as instructed by your caregiver.  Medications You may resume your normal medications unless your caregiver tells you otherwise. What you can expect today  You may experience abdominal discomfort such as a feeling of fullness or "gas" pains.   You may experience a sore throat for 2 to 3 days. This is normal. Gargling with salt water may help this.  Follow-up Your doctor will discuss the results of your test with you. SEEK IMMEDIATE MEDICAL CARE IF:  You have excessive nausea (feeling sick to your stomach) and/or vomiting.   You have severe abdominal pain and distention (swelling).   You have trouble swallowing.   You have a temperature over 100 F (37.8 C).   You have rectal bleeding or vomiting of blood.  Document Released: 10/28/2003 Document Revised: 03/04/2011 Document Reviewed: 05/10/2007

## 2017-08-09 NOTE — ED Notes (Signed)
ED Provider at bedside. 

## 2017-08-09 NOTE — ED Notes (Signed)
Day Surgery Called pt is scheduled for EGD tomorrow @ [phone], pt needs to arrive @ 0645, and also be NPO after midnight.

## 2017-08-09 NOTE — ED Triage Notes (Signed)
Pt c/o vomiting since 0500 and abd pain x 3 weeks, pt is supposed to have endoscopy 08/10/17

## 2017-08-10 ENCOUNTER — Ambulatory Visit (HOSPITAL_COMMUNITY): Payer: Self-pay | Admitting: Anesthesiology

## 2017-08-10 ENCOUNTER — Other Ambulatory Visit: Payer: Self-pay

## 2017-08-10 ENCOUNTER — Ambulatory Visit (HOSPITAL_COMMUNITY)
Admission: RE | Admit: 2017-08-10 | Discharge: 2017-08-10 | Disposition: A | Discharge status: Home / Self Care | Payer: Self-pay | Source: Ambulatory Visit | Attending: Internal Medicine | Admitting: Internal Medicine

## 2017-08-10 ENCOUNTER — Encounter (HOSPITAL_COMMUNITY)
Admission: RE | Disposition: A | Discharge status: Home / Self Care | Payer: Self-pay | Source: Ambulatory Visit | Attending: Internal Medicine

## 2017-08-10 ENCOUNTER — Encounter (HOSPITAL_COMMUNITY): Payer: Self-pay | Admitting: Emergency Medicine

## 2017-08-10 DIAGNOSIS — R112 Nausea with vomiting, unspecified: Secondary | ICD-10-CM

## 2017-08-10 DIAGNOSIS — Z803 Family history of malignant neoplasm of breast: Secondary | ICD-10-CM | POA: Insufficient documentation

## 2017-08-10 DIAGNOSIS — F17210 Nicotine dependence, cigarettes, uncomplicated: Secondary | ICD-10-CM | POA: Insufficient documentation

## 2017-08-10 DIAGNOSIS — K210 Gastro-esophageal reflux disease with esophagitis: Secondary | ICD-10-CM | POA: Insufficient documentation

## 2017-08-10 DIAGNOSIS — K259 Gastric ulcer, unspecified as acute or chronic, without hemorrhage or perforation: Secondary | ICD-10-CM | POA: Insufficient documentation

## 2017-08-10 DIAGNOSIS — Z79899 Other long term (current) drug therapy: Secondary | ICD-10-CM | POA: Insufficient documentation

## 2017-08-10 DIAGNOSIS — Z8042 Family history of malignant neoplasm of prostate: Secondary | ICD-10-CM | POA: Insufficient documentation

## 2017-08-10 DIAGNOSIS — Z818 Family history of other mental and behavioral disorders: Secondary | ICD-10-CM | POA: Insufficient documentation

## 2017-08-10 DIAGNOSIS — Z801 Family history of malignant neoplasm of trachea, bronchus and lung: Secondary | ICD-10-CM | POA: Insufficient documentation

## 2017-08-10 DIAGNOSIS — F4310 Post-traumatic stress disorder, unspecified: Secondary | ICD-10-CM | POA: Insufficient documentation

## 2017-08-10 DIAGNOSIS — F419 Anxiety disorder, unspecified: Secondary | ICD-10-CM | POA: Insufficient documentation

## 2017-08-10 DIAGNOSIS — G479 Sleep disorder, unspecified: Secondary | ICD-10-CM | POA: Insufficient documentation

## 2017-08-10 DIAGNOSIS — K2950 Unspecified chronic gastritis without bleeding: Secondary | ICD-10-CM | POA: Insufficient documentation

## 2017-08-10 DIAGNOSIS — R1013 Epigastric pain: Secondary | ICD-10-CM

## 2017-08-10 DIAGNOSIS — Z811 Family history of alcohol abuse and dependence: Secondary | ICD-10-CM | POA: Insufficient documentation

## 2017-08-10 DIAGNOSIS — K449 Diaphragmatic hernia without obstruction or gangrene: Secondary | ICD-10-CM | POA: Insufficient documentation

## 2017-08-10 DIAGNOSIS — F329 Major depressive disorder, single episode, unspecified: Secondary | ICD-10-CM | POA: Insufficient documentation

## 2017-08-10 DIAGNOSIS — K228 Other specified diseases of esophagus: Secondary | ICD-10-CM

## 2017-08-10 DIAGNOSIS — M4696 Unspecified inflammatory spondylopathy, lumbar region: Secondary | ICD-10-CM | POA: Insufficient documentation

## 2017-08-10 DIAGNOSIS — Z8249 Family history of ischemic heart disease and other diseases of the circulatory system: Secondary | ICD-10-CM | POA: Insufficient documentation

## 2017-08-10 DIAGNOSIS — K3189 Other diseases of stomach and duodenum: Secondary | ICD-10-CM

## 2017-08-10 DIAGNOSIS — Z9851 Tubal ligation status: Secondary | ICD-10-CM | POA: Insufficient documentation

## 2017-08-10 DIAGNOSIS — I252 Old myocardial infarction: Secondary | ICD-10-CM | POA: Insufficient documentation

## 2017-08-10 DIAGNOSIS — N83209 Unspecified ovarian cyst, unspecified side: Secondary | ICD-10-CM | POA: Insufficient documentation

## 2017-08-10 DIAGNOSIS — Z8619 Personal history of other infectious and parasitic diseases: Secondary | ICD-10-CM | POA: Insufficient documentation

## 2017-08-10 DIAGNOSIS — Z823 Family history of stroke: Secondary | ICD-10-CM | POA: Insufficient documentation

## 2017-08-10 DIAGNOSIS — Z825 Family history of asthma and other chronic lower respiratory diseases: Secondary | ICD-10-CM | POA: Insufficient documentation

## 2017-08-10 DIAGNOSIS — Z8261 Family history of arthritis: Secondary | ICD-10-CM | POA: Insufficient documentation

## 2017-08-10 HISTORY — PX: ESOPHAGOGASTRODUODENOSCOPY (EGD) WITH PROPOFOL: SHX5813

## 2017-08-10 HISTORY — PX: BIOPSY: SHX5522

## 2017-08-10 SURGERY — ESOPHAGOGASTRODUODENOSCOPY (EGD) WITH PROPOFOL
Anesthesia: Monitor Anesthesia Care

## 2017-08-10 MED ORDER — PROPOFOL 500 MG/50ML IV EMUL
INTRAVENOUS | Status: DC | PRN
  Administered 2017-08-10: 150 ug/kg/min via INTRAVENOUS

## 2017-08-10 MED ORDER — CHLORHEXIDINE GLUCONATE CLOTH 2 % EX PADS: 6.0000 | MEDICATED_PAD | Freq: Once | CUTANEOUS | Status: DC

## 2017-08-10 MED ORDER — SIMETHICONE 40 MG/0.6ML PO SUSP
ORAL | Status: DC | PRN
  Administered 2017-08-10: 100 mL

## 2017-08-10 MED ORDER — PROPOFOL 10 MG/ML IV BOLUS
INTRAVENOUS | Status: DC | PRN
  Administered 2017-08-10 (×2): 20 mg via INTRAVENOUS

## 2017-08-10 MED ORDER — PROPOFOL 10 MG/ML IV BOLUS
INTRAVENOUS | Status: AC
  Filled 2017-08-10: qty 60

## 2017-08-10 MED ORDER — LACTATED RINGERS IV SOLN
INTRAVENOUS | Status: DC
  Administered 2017-08-10: 08:00:00 via INTRAVENOUS

## 2017-08-10 MED ORDER — PANTOPRAZOLE SODIUM 40 MG PO TBEC
40.0000 mg | DELAYED_RELEASE_TABLET | Freq: Two times a day (BID) | ORAL | 2 refills | Status: AC
Start: 2017-08-10 — End: ?

## 2017-08-10 NOTE — Anesthesia Preprocedure Evaluation (Signed)
Anesthesia Evaluation  Patient identified by MRN, date of birth, ID band Patient awake    Reviewed: Allergy & Precautions, H&P , NPO status , Patient's Chart, lab work & pertinent test results  Airway Mallampati: II  TM Distance: >3 FB Neck ROM: full    Dental no notable dental hx.    Pulmonary neg pulmonary ROS, Current Smoker,    Pulmonary exam normal breath sounds clear to auscultation       Cardiovascular Exercise Tolerance: Good hypertension, negative cardio ROS   Rhythm:regular Rate:Normal     Neuro/Psych Anxiety Depression negative neurological ROS  negative psych ROS   GI/Hepatic negative GI ROS, Neg liver ROS,   Endo/Other  negative endocrine ROS  Renal/GU negative Renal ROS  negative genitourinary   Musculoskeletal   Abdominal   Peds  Hematology negative hematology ROS (+)   Anesthesia Other Findings Alcoholism  Drug addiction  PTSD  Reproductive/Obstetrics negative OB ROS                             Anesthesia Physical Anesthesia Plan  ASA: II  Anesthesia Plan: MAC   Post-op Pain Management:    Induction:   PONV Risk Score and Plan:   Airway Management Planned:   Additional Equipment:   Intra-op Plan:   Post-operative Plan:   Informed Consent: I have reviewed the patients History and Physical, chart, labs and discussed the procedure including the risks, benefits and alternatives for the proposed anesthesia with the patient or authorized representative who has indicated his/her understanding and acceptance.   Dental Advisory Given  Plan Discussed with: CRNA  Anesthesia Plan Comments:         Anesthesia Quick Evaluation

## 2017-08-10 NOTE — Transfer of Care (Signed)
Immediate Anesthesia Transfer of Care Note  Patient: Brittany Rios  Procedure(s) Performed: ESOPHAGOGASTRODUODENOSCOPY (EGD) WITH PROPOFOL (N/A ) BIOPSY  Patient Location: PACU  Anesthesia Type:MAC  Level of Consciousness: awake, alert  and patient cooperative  Airway & Oxygen Therapy: Patient Spontanous Breathing  Post-op Assessment: Report given to RN and Post -op Vital signs reviewed and stable  Post vital signs: Reviewed and stable  Last Vitals:  Vitals Value Taken Time  BP    Temp    Pulse    Resp    SpO2      Last Pain:  Vitals:   08/10/17 0900  TempSrc:   PainSc: 0-No pain         Complications: No apparent anesthesia complications

## 2017-08-10 NOTE — Anesthesia Postprocedure Evaluation (Signed)
Anesthesia Post Note  Patient: Brittany Rios  Procedure(s) Performed: ESOPHAGOGASTRODUODENOSCOPY (EGD) WITH PROPOFOL (N/A ) BIOPSY  Patient location during evaluation: Short Stay Anesthesia Type: MAC Level of consciousness: awake and alert, oriented and patient cooperative Pain management: satisfactory to patient Vital Signs Assessment: post-procedure vital signs reviewed and stable Respiratory status: spontaneous breathing Cardiovascular status: stable Postop Assessment: no apparent nausea or vomiting Anesthetic complications: no     Last Vitals:  Vitals:   08/10/17 0945 08/10/17 1004  BP: (!) 136/96 (!) 137/94  Pulse: 64 63  Resp: 13   Temp:  36.6 C  SpO2: 100% 100%    Last Pain:  Vitals:   08/10/17 1004  TempSrc: Oral  PainSc: 8                  Eloise Mula
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
## 2017-08-10 NOTE — Discharge Instructions (Signed)
Discontinue sucralfate but continue other medications as before. Pantoprazole 40 mg by mouth 30 minutes before breakfast and evening meal daily. Resume diet. Anti-reflux sheet. No driving for 24 hours. Physician will call with biopsy results later this week.   Upper Endoscopy, Care After Refer to this sheet in the next few weeks. These instructions provide you with information about caring for yourself after your procedure. Your health care provider may also give you more specific instructions. Your treatment has been planned according to current medical practices, but problems sometimes occur. Call your health care provider if you have any problems or questions after your procedure. What can I expect after the procedure? After the procedure, it is common to have:  A sore throat.  Bloating.  Nausea.  Follow these instructions at home:  Follow instructions from your health care provider about what to eat or drink after your procedure.  Return to your normal activities as told by your health care provider. Ask your health care provider what activities are safe for you.  Take over-the-counter and prescription medicines only as told by your health care provider.  Do not drive for 24 hours if you received a sedative.  Keep all follow-up visits as told by your health care provider. This is important. Contact a health care provider if:  You have a sore throat that lasts longer than one day.  You have trouble swallowing. Get help right away if:  You have a fever.  You vomit blood or your vomit looks like coffee grounds.  You have bloody, black, or tarry stools.  You have a severe sore throat or you cannot swallow.  You have difficulty breathing.  You have severe pain in your chest or belly. This information is not intended to replace advice given to you by your health care provider. Make sure you discuss any questions you have with your health care provider. Document  Released: 09/14/2011 Document Revised: 08/21/2015 Document Reviewed: 12/26/2014 Elsevier Interactive Patient Education  2018 [employer].  Gastroesophageal Reflux Disease, Adult Normally, food travels down the esophagus and stays in the stomach to be digested. However, when a person has gastroesophageal reflux disease (GERD), food and stomach acid move back up into the esophagus. When this happens, the esophagus becomes sore and inflamed. Over time, GERD can create small holes (ulcers) in the lining of the esophagus. What are the causes? This condition is caused by a problem with the muscle between the esophagus and the stomach (lower esophageal sphincter, or LES). Normally, the LES muscle closes after food passes through the esophagus to the stomach. When the LES is weakened or abnormal, it does not close properly, and that allows food and stomach acid to go back up into the esophagus. The LES can be weakened by certain dietary substances, medicines, and medical conditions, including:  Tobacco use.  Pregnancy.  Having a hiatal hernia.  Heavy alcohol use.  Certain foods and beverages, such as coffee, chocolate, onions, and peppermint.  What increases the risk? This condition is more likely to develop in:  People who have an increased body weight.  People who have connective tissue disorders.  People who use NSAID medicines.  What are the signs or symptoms? Symptoms of this condition include:  Heartburn.  Difficult or painful swallowing.  The feeling of having a lump in the throat.  Abitter taste in the mouth.  Bad breath.  Having a large amount of saliva.  Having an upset or bloated stomach.  Belching.  Chest pain.  Shortness of breath or wheezing.  Ongoing (chronic) cough or a night-time cough.  Wearing away of tooth enamel.  Weight loss.  Different conditions can cause chest pain. Make sure to see your health care provider if you experience chest  pain. How is this diagnosed? Your health care provider will take a medical history and perform a physical exam. To determine if you have mild or severe GERD, your health care provider may also monitor how you respond to treatment. You may also have other tests, including:  An endoscopy toexamine your stomach and esophagus with a small camera.  A test thatmeasures the acidity level in your esophagus.  A test thatmeasures how much pressure is on your esophagus.  A barium swallow or modified barium swallow to show the shape, size, and functioning of your esophagus.  How is this treated? The goal of treatment is to help relieve your symptoms and to prevent complications. Treatment for this condition may vary depending on how severe your symptoms are. Your health care provider may recommend:  Changes to your diet.  Medicine.  Surgery.  Follow these instructions at home: Diet  Follow a diet as recommended by your health care provider. This may involve avoiding foods and drinks such as: ? Coffee and tea (with or without caffeine). ? Drinks that containalcohol. ? Energy drinks and sports drinks. ? Carbonated drinks or sodas. ? Chocolate and cocoa. ? Peppermint and mint flavorings. ? Garlic and onions. ? Horseradish. ? Spicy and acidic foods, including peppers, chili powder, curry powder, vinegar, hot sauces, and barbecue sauce. ? Citrus fruit juices and citrus fruits, such as oranges, lemons, and limes. ? Tomato-based foods, such as red sauce, chili, salsa, and pizza with red sauce. ? Fried and fatty foods, such as donuts, french fries, potato chips, and high-fat dressings. ? High-fat meats, such as hot dogs and fatty cuts of red and white meats, such as rib eye steak, sausage, ham, and bacon. ? High-fat dairy items, such as whole milk, butter, and cream cheese.  Eat small, frequent meals instead of large meals.  Avoid drinking large amounts of liquid with your meals.  Avoid  eating meals during the 2-3 hours before bedtime.  Avoid lying down right after you eat.  Do not exercise right after you eat. General instructions  Pay attention to any changes in your symptoms.  Take over-the-counter and prescription medicines only as told by your health care provider. Do not take aspirin, ibuprofen, or other NSAIDs unless your health care provider told you to do so.  Do not use any tobacco products, including cigarettes, chewing tobacco, and e-cigarettes. If you need help quitting, ask your health care provider.  Wear loose-fitting clothing. Do not wear anything tight around your waist that causes pressure on your abdomen.  Raise (elevate) the head of your bed 6 inches (15cm).  Try to reduce your stress, such as with yoga or meditation. If you need help reducing stress, ask your health care provider.  If you are overweight, reduce your weight to an amount that is healthy for you. Ask your health care provider for guidance about a safe weight loss goal.  Keep all follow-up visits as told by your health care provider. This is important. Contact a health care provider if:  You have new symptoms.  You have unexplained weight loss.  You have difficulty swallowing, or it hurts to swallow.  You have wheezing or a persistent cough.  Your symptoms do not improve with treatment.  You  have a hoarse voice. Get help right away if:  You have pain in your arms, neck, jaw, teeth, or back.  You feel sweaty, dizzy, or light-headed.  You have chest pain or shortness of breath.  You vomit and your vomit looks like blood or coffee grounds.  You faint.  Your stool is bloody or black.  You cannot swallow, drink, or eat. This information is not intended to replace advice given to you by your health care provider. Make sure you discuss any questions you have with your health care provider. Document Released: 12/23/2004 Document Revised: 08/13/2015 Document Reviewed:  07/10/2014 Elsevier Interactive Patient Education  [employer].

## 2017-08-10 NOTE — Op Note (Signed)
[facility] Patient Name: Brittany Rios Procedure Date: 08/10/2017 8:15 AM MRN: [id number] Date of Birth: 1975/07/01 Attending MD: Hildred Laser , MD CSN: [id number] Age: 42 Admit Type: Outpatient Procedure:                Upper GI endoscopy Indications:              Epigastric abdominal pain, Nausea with vomiting Providers:                Hildred Laser, MD, Rosina Lowenstein, RN, Randa Spike, Technician Referring MD:              Medicines:                Lidocaine spray, Propofol per Anesthesia Complications:            No immediate complications. Estimated Blood Loss:     Estimated blood loss was minimal. Procedure:                Pre-Anesthesia Assessment:                           - Prior to the procedure, a History and Physical                            was performed, and patient medications and                            allergies were reviewed. The patient's tolerance of                            previous anesthesia was also reviewed. The risks                            and benefits of the procedure and the sedation                            options and risks were discussed with the patient.                            All questions were answered, and informed consent                            was obtained. Prior Anticoagulants: The patient has                            taken no previous anticoagulant or antiplatelet                            agents. ASA Grade Assessment: II - A patient with                            mild systemic disease. After reviewing the risks  and benefits, the patient was deemed in                            satisfactory condition to undergo the procedure.                           After obtaining informed consent, the endoscope was                            passed under direct vision. Throughout the                            procedure, the patient's blood pressure, pulse, and                 oxygen saturations were monitored continuously. The                            EG-[id number] ([id number]) scope was introduced through the                            and advanced to the second part of duodenum. The                            upper GI endoscopy was accomplished without                            difficulty. The patient tolerated the procedure                            well. Scope In: 8:56:30 AM Scope Out: 9:04:44 AM Total Procedure Duration: 0 hours 8 minutes 14 seconds  Findings:      The proximal esophagus and mid esophagus were normal.      LA Grade B (one or more mucosal breaks greater than 5 mm, not extending       between the tops of two mucosal folds) esophagitis was found 40 to 41 cm       from the incisors.      The Z-line was irregular and was found 41 cm from the incisors.      A 2 cm hiatal hernia was present.      A few erosions were found in the gastric antrum. Biopsies were taken       with a cold forceps for histology.      The exam of the stomach was otherwise normal.      The duodenal bulb and second portion of the duodenum were normal. Impression:               - Normal proximal esophagus and mid esophagus.                           - LA Grade B reflux esophagitis.                           - Z-line irregular, 41 cm from the incisors.                           -  2 cm hiatal hernia.                           - Erosive gastropathy. Biopsied.                           - Normal duodenal bulb and second portion of the                            duodenum. Moderate Sedation:      Per Anesthesia Care Recommendation:           - Patient has a contact number available for                            emergencies. The signs and symptoms of potential                            delayed complications were discussed with the                            patient. Return to normal activities tomorrow.                            Written discharge instructions were  provided to the                            patient.                           - Resume previous diet today.                           - Continue present medications but stop Sucralfate.                           - Use Protonix (pantoprazole) 40 mg PO BID today.                           - Await pathology results. Procedure Code(s):        --- Professional ---                           [phone], Esophagogastroduodenoscopy, flexible,                            transoral; with biopsy, single or multiple Diagnosis Code(s):        --- Professional ---                           K21.0, Gastro-esophageal reflux disease with                            esophagitis                           K22.8, Other specified diseases of esophagus  K44.9, Diaphragmatic hernia without obstruction or                            gangrene                           K31.89, Other diseases of stomach and duodenum                           R10.13, Epigastric pain                           R11.2, Nausea with vomiting, unspecified CPT copyright 2017 [employer]. All rights reserved. The codes documented in this report are preliminary and upon coder review may  be revised to meet current compliance requirements. Hildred Laser, MD Hildred Laser, MD 08/10/2017 9:19:13 AM This report has been signed electronically. Number of Addenda: 0

## 2017-08-11 ENCOUNTER — Telehealth (INDEPENDENT_AMBULATORY_CARE_PROVIDER_SITE_OTHER): Payer: Self-pay | Admitting: Internal Medicine

## 2017-08-11 ENCOUNTER — Telehealth (INDEPENDENT_AMBULATORY_CARE_PROVIDER_SITE_OTHER): Payer: Self-pay | Admitting: *Deleted

## 2017-08-11 ENCOUNTER — Encounter: Payer: Self-pay | Admitting: Advanced Practice Midwife

## 2017-08-11 ENCOUNTER — Other Ambulatory Visit (INDEPENDENT_AMBULATORY_CARE_PROVIDER_SITE_OTHER): Payer: Self-pay | Admitting: *Deleted

## 2017-08-11 ENCOUNTER — Other Ambulatory Visit (INDEPENDENT_AMBULATORY_CARE_PROVIDER_SITE_OTHER): Payer: Self-pay | Admitting: Internal Medicine

## 2017-08-11 DIAGNOSIS — K219 Gastro-esophageal reflux disease without esophagitis: Secondary | ICD-10-CM

## 2017-08-11 DIAGNOSIS — R109 Unspecified abdominal pain: Secondary | ICD-10-CM

## 2017-08-11 DIAGNOSIS — R112 Nausea with vomiting, unspecified: Secondary | ICD-10-CM

## 2017-08-11 MED ORDER — SUCRALFATE 1 GM/10ML PO SUSP: 1.0000 g | Freq: Four times a day (QID) | ORAL | 1 refills | Status: AC

## 2017-08-11 NOTE — Telephone Encounter (Signed)
Message was sent to Provider in office to address. Patient had procedure on 08/10/2017.

## 2017-08-11 NOTE — Telephone Encounter (Signed)
Patients sgo left message stated patient is still having abdominal pain - would like a call back at [phone]

## 2017-08-11 NOTE — Progress Notes (Signed)
Sent to Lacretia Nicks to arrange.

## 2017-08-11 NOTE — Telephone Encounter (Signed)
Here is the results of the surgical pathology.  Stomach, biopsy - MILD CHRONIC GASTRITIS. - NEGATIVE FOR HELICOBACTER PYLORI. - NO INTESTINAL METAPLASIA, DYSPLASIA, OR MALIGNANCY

## 2017-08-11 NOTE — Telephone Encounter (Signed)
Please call in Carafate qid for this patient. Thank u

## 2017-08-11 NOTE — Telephone Encounter (Signed)
Per Dr.Rehman arrange Hida Scan. Order has been placed for diagnostic.

## 2017-08-12 ENCOUNTER — Telehealth (INDEPENDENT_AMBULATORY_CARE_PROVIDER_SITE_OTHER): Payer: Self-pay | Admitting: Internal Medicine

## 2017-08-12 ENCOUNTER — Encounter (HOSPITAL_COMMUNITY): Payer: Self-pay | Admitting: Internal Medicine

## 2017-08-12 ENCOUNTER — Telehealth (INDEPENDENT_AMBULATORY_CARE_PROVIDER_SITE_OTHER): Payer: Self-pay | Admitting: *Deleted

## 2017-08-12 MED ORDER — HYDROCODONE-ACETAMINOPHEN 5-325 MG PO TABS: 1.0000 | ORAL_TABLET | Freq: Four times a day (QID) | ORAL | 0 refills | Status: AC | PRN

## 2017-08-12 NOTE — Telephone Encounter (Signed)
Per Dr.Rehman's instruction , Mr Brittany Rios was called and given the results of the patient's biopsy done at the time of the procedure. A Hida scan has been arranged and the instructions to the patient for this 08/15/2017.   A one time prescription for Hydrocodone will be given.

## 2017-08-12 NOTE — Telephone Encounter (Signed)
Rx went to her pharmacy

## 2017-08-12 NOTE — Telephone Encounter (Signed)
HIDA scan sch'd 08/15/17 at 10 (945), npo after midnight, no pain meds after midnight, patient aware

## 2017-08-15 ENCOUNTER — Encounter (HOSPITAL_COMMUNITY): Payer: Self-pay

## 2017-08-15 ENCOUNTER — Encounter (HOSPITAL_COMMUNITY)
Admission: RE | Admit: 2017-08-15 | Discharge: 2017-08-15 | Disposition: A | Discharge status: Home / Self Care | Payer: Self-pay | Source: Ambulatory Visit | Attending: Internal Medicine | Admitting: Internal Medicine

## 2017-08-15 DIAGNOSIS — R112 Nausea with vomiting, unspecified: Secondary | ICD-10-CM | POA: Insufficient documentation

## 2017-08-15 DIAGNOSIS — R109 Unspecified abdominal pain: Secondary | ICD-10-CM | POA: Insufficient documentation

## 2017-08-15 MED ORDER — TECHNETIUM TC 99M MEBROFENIN IV KIT
5.0000 | PACK | Freq: Once | INTRAVENOUS | Status: AC | PRN
  Administered 2017-08-15: 5 via INTRAVENOUS

## 2017-08-18 ENCOUNTER — Telehealth (INDEPENDENT_AMBULATORY_CARE_PROVIDER_SITE_OTHER): Payer: Self-pay | Admitting: *Deleted

## 2017-08-18 NOTE — Telephone Encounter (Signed)
Per Dr.Rehman on 08/17/2017 ask that patient's PPI be changed to Lansoprazole 30 mg - Patient is to take 1 by mouth twice a day. #60 5 refills. This was called to [facility] [place].  Patient states that she had a rash from the Pantoprazole.

## 2017-08-19 ENCOUNTER — Encounter: Payer: Self-pay | Admitting: Women's Health

## 2017-08-23 ENCOUNTER — Telehealth (INDEPENDENT_AMBULATORY_CARE_PROVIDER_SITE_OTHER): Payer: Self-pay | Admitting: *Deleted

## 2017-08-23 NOTE — Telephone Encounter (Signed)
Noted. I hope she can follow-up at pain clinic

## 2017-08-23 NOTE — Progress Notes (Signed)
This encounter was created in error - please disregard.

## 2017-08-23 NOTE — Telephone Encounter (Signed)
FYI: rec'd message from [facility] -- Ms Vaness will  Call them to schedule a appt once she has the self pay amount

## 2017-08-31 ENCOUNTER — Ambulatory Visit (INDEPENDENT_AMBULATORY_CARE_PROVIDER_SITE_OTHER): Payer: Self-pay | Admitting: Internal Medicine

## 2017-09-01 ENCOUNTER — Other Ambulatory Visit: Payer: Self-pay | Admitting: Obstetrics & Gynecology

## 2019-02-05 IMAGING — CT CT ABD-PELV W/ CM
2 of 5 series · 16 of 46 positions shown, 18 images · IV contrast (Isovue)
Comparison: CT scan of July 27, 2017.

CLINICAL DATA: Acute generalized abdominal pain.

EXAM:
CT ABDOMEN AND PELVIS WITH CONTRAST
TECHNIQUE: Multidetector CT imaging of the abdomen and pelvis was performed
using the standard protocol following bolus administration of
intravenous contrast.
CONTRAST:  30mL VQKA32-BKK IOPAMIDOL (VQKA32-BKK) INJECTION 61%
orally, 100mL VQKA32-BKK IOPAMIDOL (VQKA32-BKK) INJECTION 61%
intravenously.

[Series 2: axial st · axial · 0.67mm/px · z∈[+1446,+1856]mm · 13 of 94 slices shown, 15 images]
[im 6/94  soft-tissue]
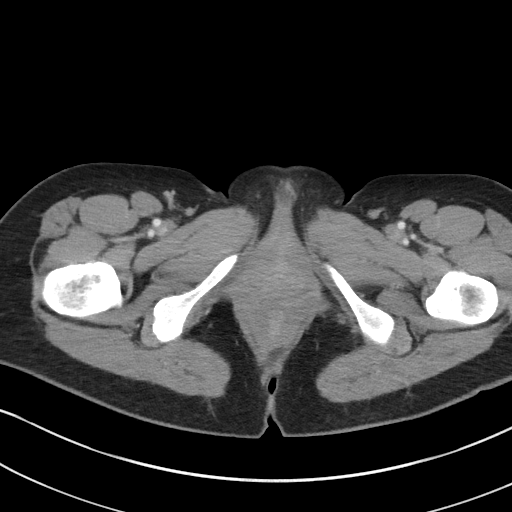
[im 6/94  bone]
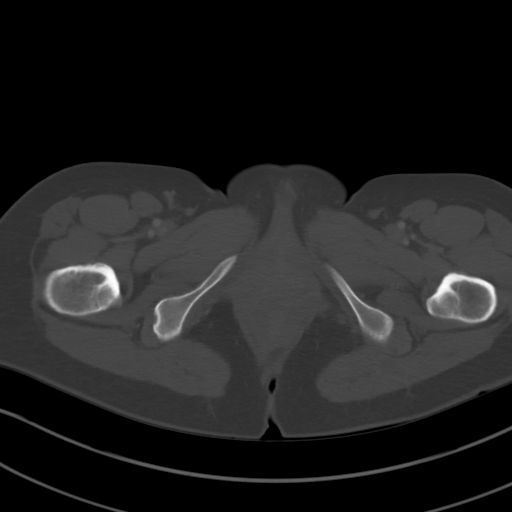
[im 11/94  soft-tissue]
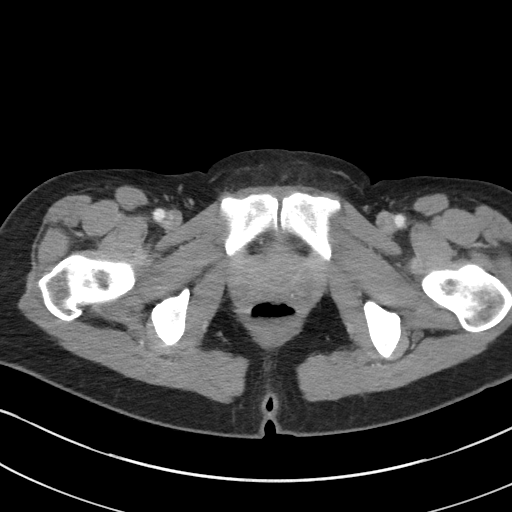
[im 21/94  soft-tissue]
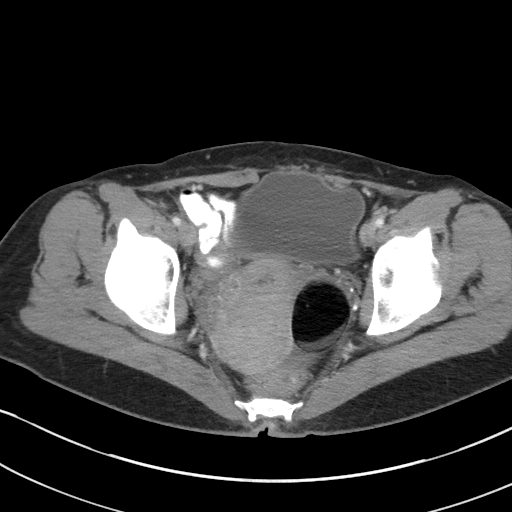
[im 26/94  soft-tissue]
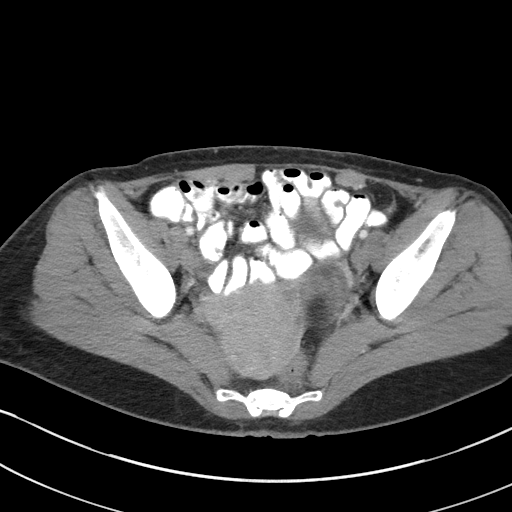
[im 32/94  soft-tissue]
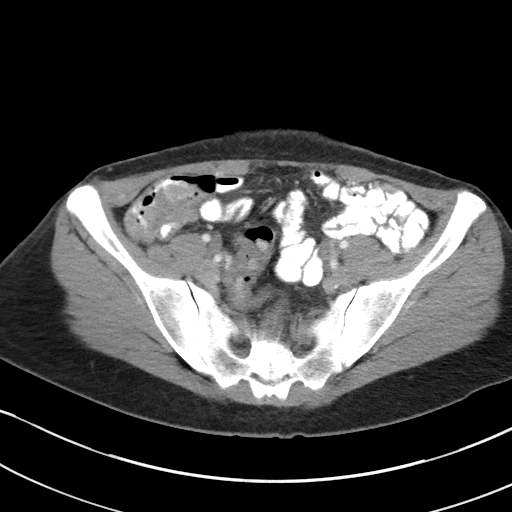
[im 42/94  soft-tissue]
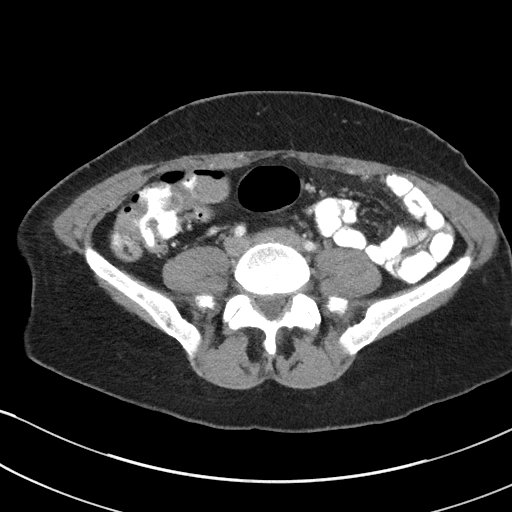
[im 47/94  soft-tissue]
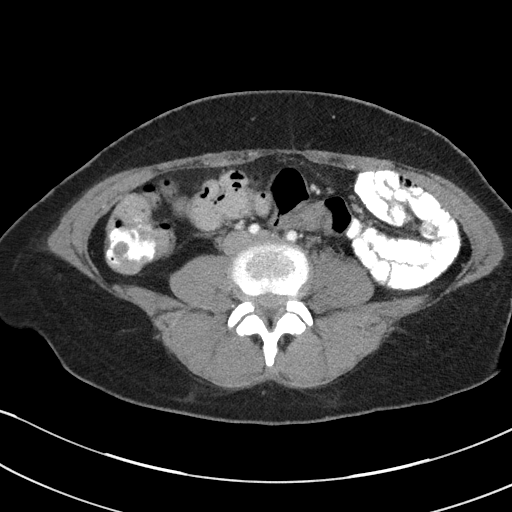
[im 52/94  soft-tissue]
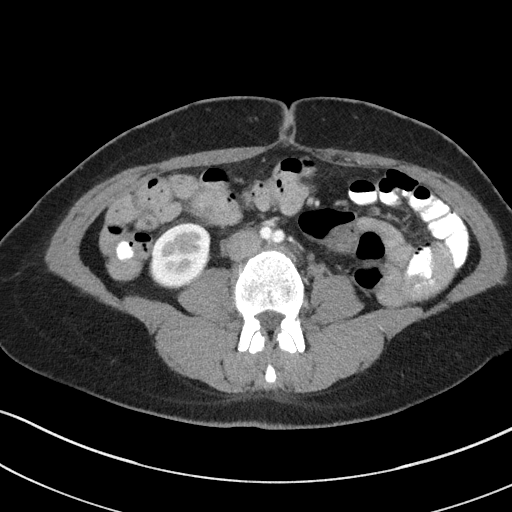
[im 63/94  soft-tissue]
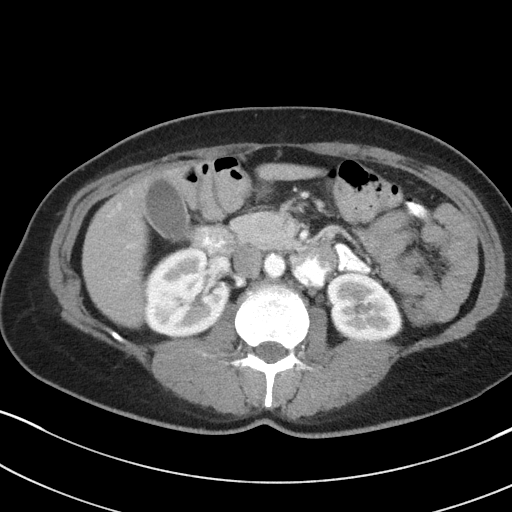
[im 63/94  bone]
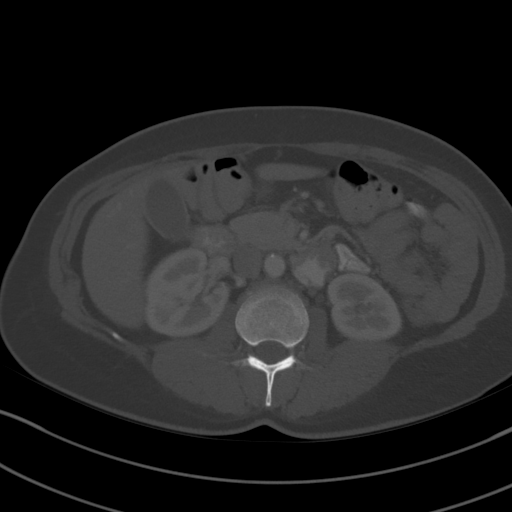
[im 68/94  soft-tissue]
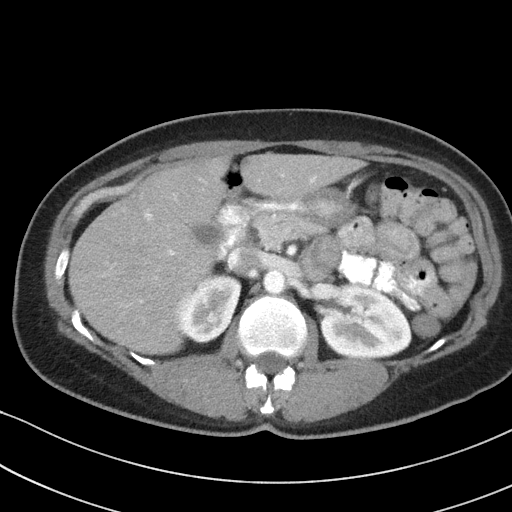
[im 73/94  soft-tissue]
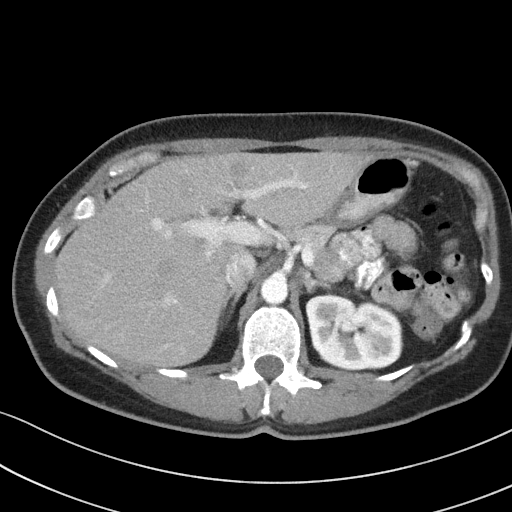
[im 83/94  soft-tissue]
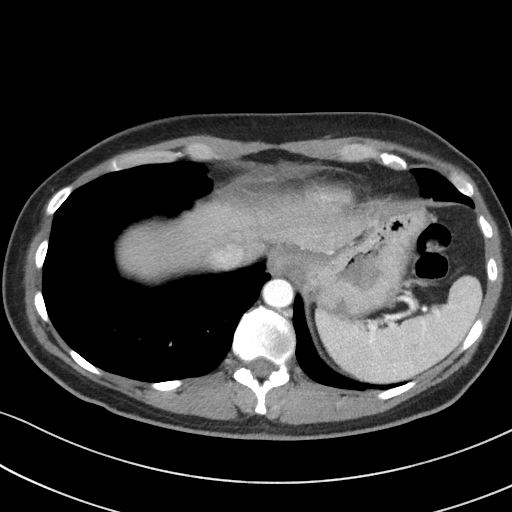
[im 88/94  soft-tissue]
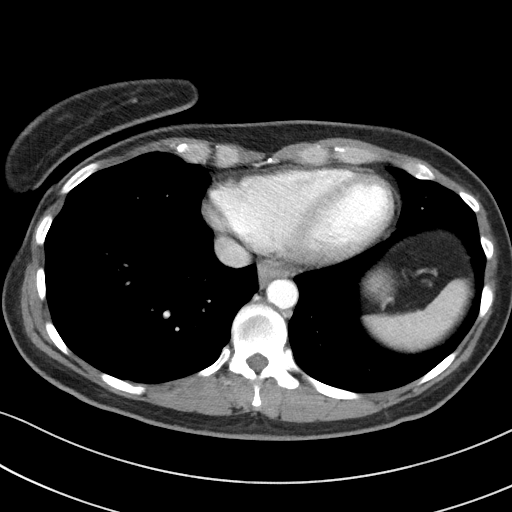

[Series 6: coronal st · coronal · 0.74mm/px · 3 of 86 slices shown]
[im 29/86  soft-tissue]
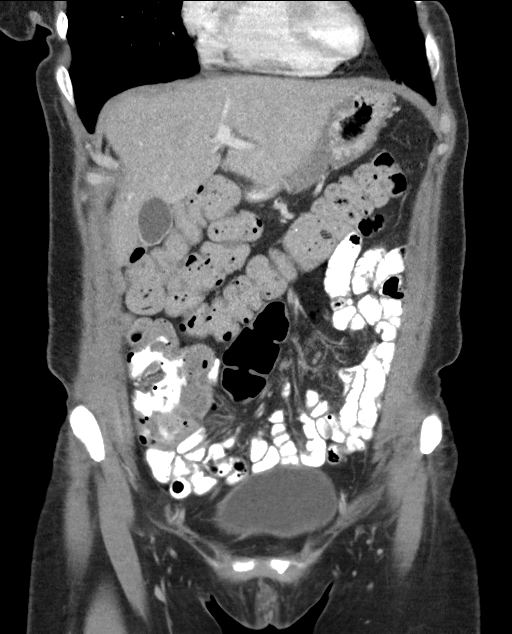
[im 38/86  soft-tissue]
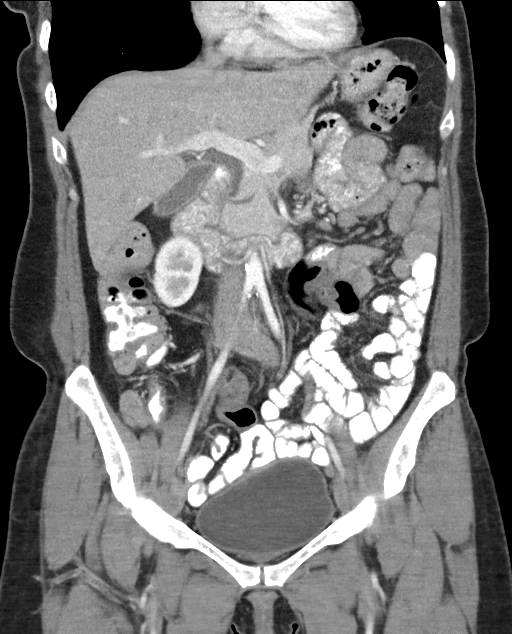
[im 48/86  soft-tissue]
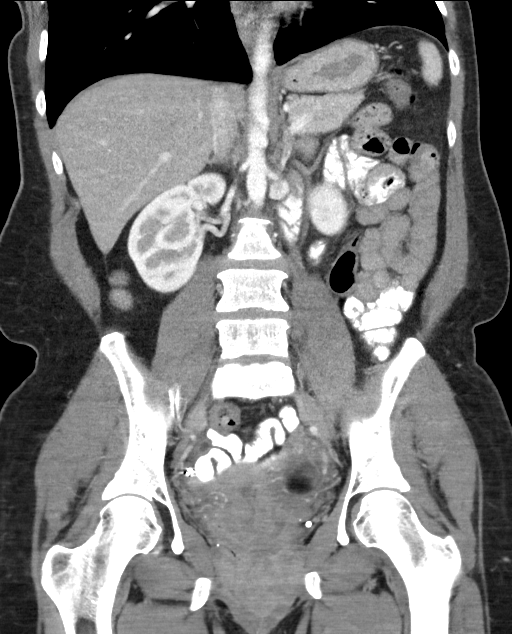

[16 of 46 positions shown; findings below may reference images not displayed]

FINDINGS: Lower chest: No acute abnormality.

Hepatobiliary: No focal liver abnormality is seen. No gallstones,
gallbladder wall thickening, or biliary dilatation.

Pancreas: Unremarkable. No pancreatic ductal dilatation or
surrounding inflammatory changes.

Spleen: Normal in size without focal abnormality.

Adrenals/Urinary Tract: Adrenal glands are unremarkable. Kidneys are
normal, without renal calculi, focal lesion, or hydronephrosis.
Bladder is unremarkable.

Stomach/Bowel: The stomach appears normal. There is no evidence of
bowel obstruction or inflammation. Status post appendectomy.

Vascular/Lymphatic: Aortic atherosclerosis. No enlarged abdominal or
pelvic lymph nodes.

Reproductive: Uterus is unremarkable. No right adnexal abnormality
is noted. Stable 5.1 cm dermoid tumor is noted in left adnexal
region.

Other: No abdominal wall hernia or abnormality. No abdominopelvic
ascites.

Musculoskeletal: No acute or significant osseous findings.
IMPRESSION: Stable 5 cm left ovarian dermoid tumor is noted.

No other significant abnormality seen in the abdomen or pelvis.

## 2020-12-03 ENCOUNTER — Telehealth: Payer: Self-pay | Admitting: *Deleted

## 2020-12-03 NOTE — Telephone Encounter (Signed)
Spoke with Anderson Malta at Dr Valinda Party office, scheduled the patient for a new patient appt with Dr Berline Lopes on 9/9 at 9 am. Dr Erskine's office to contact the patient

## 2020-12-04 ENCOUNTER — Other Ambulatory Visit: Payer: Self-pay

## 2020-12-04 ENCOUNTER — Ambulatory Visit
Admission: RE | Admit: 2020-12-04 | Discharge: 2020-12-04 | Disposition: A | Discharge status: Home / Self Care | Payer: Self-pay | Source: Ambulatory Visit | Attending: Gynecologic Oncology | Admitting: Gynecologic Oncology

## 2020-12-04 DIAGNOSIS — N858 Other specified noninflammatory disorders of uterus: Secondary | ICD-10-CM

## 2020-12-04 NOTE — Progress Notes (Signed)
This encountered was created in error. This patient was incorrectly scheduled in my clinic.  Jeral Pinch MD Gynecologic Oncology

## 2020-12-05 ENCOUNTER — Other Ambulatory Visit: Payer: Self-pay

## 2020-12-05 ENCOUNTER — Encounter: Payer: Self-pay | Admitting: Gynecologic Oncology

## 2020-12-05 ENCOUNTER — Telehealth: Payer: Self-pay | Admitting: *Deleted

## 2020-12-05 ENCOUNTER — Inpatient Hospital Stay: Payer: Medicaid Other | Admitting: Gynecologic Oncology

## 2020-12-05 DIAGNOSIS — N858 Other specified noninflammatory disorders of uterus: Secondary | ICD-10-CM

## 2020-12-05 NOTE — Progress Notes (Signed)
Entered in error

## 2020-12-05 NOTE — Telephone Encounter (Signed)
Telephone note for 9/7 entered in the wrong patient chart

## 2020-12-30 ENCOUNTER — Telehealth: Payer: Self-pay | Admitting: *Deleted

## 2020-12-30 NOTE — Telephone Encounter (Signed)
Open in error

## 2021-06-22 ENCOUNTER — Other Ambulatory Visit (HOSPITAL_COMMUNITY): Payer: Self-pay
